# Patient Record
Sex: Female | Born: 1980 | Race: White | Hispanic: No | Marital: Married | Smoking: Never smoker
Health system: Southern US, Community
[De-identification: ages and names within clinical notes are randomized; demographics above are authoritative.]

## PROBLEM LIST (undated history)

## (undated) DIAGNOSIS — O009 Unspecified ectopic pregnancy without intrauterine pregnancy: Secondary | ICD-10-CM

## (undated) DIAGNOSIS — M5020 Other cervical disc displacement, unspecified cervical region: Secondary | ICD-10-CM

## (undated) DIAGNOSIS — F32A Depression, unspecified: Secondary | ICD-10-CM

## (undated) DIAGNOSIS — F329 Major depressive disorder, single episode, unspecified: Secondary | ICD-10-CM

## (undated) DIAGNOSIS — F419 Anxiety disorder, unspecified: Secondary | ICD-10-CM

## (undated) DIAGNOSIS — D649 Anemia, unspecified: Secondary | ICD-10-CM

## (undated) DIAGNOSIS — G43909 Migraine, unspecified, not intractable, without status migrainosus: Secondary | ICD-10-CM

## (undated) HISTORY — DX: Depression, unspecified: F32.A

## (undated) HISTORY — DX: Major depressive disorder, single episode, unspecified: F32.9

## (undated) HISTORY — PX: SPINE SURGERY: SHX786

## (undated) HISTORY — DX: Migraine, unspecified, not intractable, without status migrainosus: G43.909

## (undated) HISTORY — DX: Anemia, unspecified: D64.9

## (undated) HISTORY — DX: Anxiety disorder, unspecified: F41.9

## (undated) HISTORY — DX: Unspecified ectopic pregnancy without intrauterine pregnancy: O00.90

## (undated) HISTORY — DX: Other cervical disc displacement, unspecified cervical region: M50.20

---

## 2001-04-01 HISTORY — PX: APPENDECTOMY: SHX54

## 2001-10-12 ENCOUNTER — Encounter (INDEPENDENT_AMBULATORY_CARE_PROVIDER_SITE_OTHER): Payer: Self-pay | Admitting: Specialist

## 2001-10-12 ENCOUNTER — Inpatient Hospital Stay (HOSPITAL_COMMUNITY): Admission: EM | Admit: 2001-10-12 | Discharge: 2001-10-13 | Payer: Self-pay | Admitting: Emergency Medicine

## 2001-10-12 ENCOUNTER — Encounter: Payer: Self-pay | Admitting: Emergency Medicine

## 2001-12-11 ENCOUNTER — Other Ambulatory Visit: Admission: RE | Admit: 2001-12-11 | Discharge: 2001-12-11 | Payer: Self-pay | Admitting: Obstetrics & Gynecology

## 2002-10-15 ENCOUNTER — Other Ambulatory Visit: Admission: RE | Admit: 2002-10-15 | Discharge: 2002-10-15 | Payer: Self-pay | Admitting: Obstetrics and Gynecology

## 2004-11-18 ENCOUNTER — Emergency Department (HOSPITAL_COMMUNITY): Admission: EM | Admit: 2004-11-18 | Discharge: 2004-11-18 | Payer: Self-pay | Admitting: Family Medicine

## 2005-04-01 HISTORY — PX: ANKLE SURGERY: SHX546

## 2005-06-29 ENCOUNTER — Emergency Department (HOSPITAL_COMMUNITY): Admission: EM | Admit: 2005-06-29 | Discharge: 2005-06-29 | Payer: Self-pay | Admitting: Family Medicine

## 2005-06-29 ENCOUNTER — Ambulatory Visit (HOSPITAL_COMMUNITY): Admission: RE | Admit: 2005-06-29 | Discharge: 2005-06-29 | Payer: Self-pay | Admitting: Family Medicine

## 2005-09-03 ENCOUNTER — Ambulatory Visit (HOSPITAL_COMMUNITY): Admission: RE | Admit: 2005-09-03 | Discharge: 2005-09-03 | Payer: Self-pay | Admitting: Family Medicine

## 2005-12-25 ENCOUNTER — Ambulatory Visit (HOSPITAL_COMMUNITY): Admission: RE | Admit: 2005-12-25 | Discharge: 2005-12-25 | Payer: Self-pay | Admitting: Orthopedic Surgery

## 2006-06-15 ENCOUNTER — Emergency Department (HOSPITAL_COMMUNITY): Admission: EM | Admit: 2006-06-15 | Discharge: 2006-06-15 | Payer: Self-pay | Admitting: Family Medicine

## 2006-08-05 ENCOUNTER — Encounter: Admission: RE | Admit: 2006-08-05 | Discharge: 2006-08-05 | Payer: Self-pay | Admitting: Family Medicine

## 2006-08-27 ENCOUNTER — Encounter: Admission: RE | Admit: 2006-08-27 | Discharge: 2006-08-27 | Payer: Self-pay | Admitting: Family Medicine

## 2006-09-10 ENCOUNTER — Encounter: Admission: RE | Admit: 2006-09-10 | Discharge: 2006-09-10 | Payer: Self-pay | Admitting: Family Medicine

## 2006-12-25 ENCOUNTER — Other Ambulatory Visit: Admission: RE | Admit: 2006-12-25 | Discharge: 2006-12-25 | Payer: Self-pay | Admitting: Obstetrics and Gynecology

## 2009-04-01 HISTORY — PX: OTHER SURGICAL HISTORY: SHX169

## 2010-08-17 NOTE — Op Note (Signed)
[facility]  Patient:    Regina Tyler, Regina Tyler Visit Number: [id number] MRN: [id number]          Service Type: SUR Location: 3W [id number] 02 Attending Physician:  Henrene Dodge Dictated by:   Anselm Pancoast. Zachery Dakins, M.D. Proc. Date: 10/12/01 Admit Date:  10/12/2001 Discharge Date: 10/13/2001                             Operative Report  PREOPERATIVE DIAGNOSIS:  Acute appendicitis.  POSTOPERATIVE DIAGNOSIS:  Acute appendicitis.  OPERATION:  Appendectomy.  ANESTHESIA:  General.  SURGEON:  Anselm Pancoast. Zachery Dakins, M.D.  HISTORY:  The patient is a 30 year old female student in college, here from [place] who started having the onset of abdominal pain yesterday, sort of mild generalized, and then shifted to the lower abdomen.  She was seen in the ER at approximately 2 a.m.  They did labs and mildly elevated white count.  Negative urinalysis and then obtained a CT, and the CT showed a mildly enlarged appendix in the typical appendiceal area and there is where her pain is.  I was called at about 6:15, and on examination, she was definitely tender in the right lower quadrant.  She is on Wellbutrin for chronic depression and I was in agreement with the findings.  The CT does not show a markedly inflamed appendix, but the radiologist said this is definitely an enlarged early inflamed appendix.  DESCRIPTION OF PROCEDURE:  The patient was given 3 g of Unasyn and was taken to the operative suite.  She will have a small right lower quadrant incision and will be the same recuperation period as a laparoscopic appendectomy.  The incision was made and sharp dissection was made through the skin and subcutaneous, external oblique and internal oblique opened.  The peritoneum was opened.  An [employer] and the small end of a Goulet retractor could be placed in the incision.  You could see the appendix, flipped it up, and it was hyperemic.  There was no evidence of any  significant periappendiceal infection.  The appendiceal base was freed up.  The mesentery was tied between [facility] with 2-0 Vicryl and then the appendix was crushed and ligated with a 2-0 Vicryl pursestring suture, a 3-0 silk was placed, and the appendix was removed.  The stump inverted and the pursestring suture tied.  I placed a second Z-stitch for reinforcements.  There was no evidence of any bleeding. The cecum was dropped back into the peritoneal cavity.  The peritoneum and transversalis was closed in two layers with 2-0 Vicryl.  The external oblique was closed with interrupted sutures of 2-0 Vicryl, the Scarpas with 4-0 Vicryl subcuticular, and Benzoin and Steri-Strips on the skin.  The patient tolerated the procedure nicely and was sent to the recovery room extubated and breathing nicely.  I placed a little Marcaine in the incision for postoperative pain control.  Hopefully, the patient will be able to eat later today, and will probably discharge in the a.m. Dictated by:   Anselm Pancoast. Zachery Dakins, M.D. Attending Physician:  Henrene Dodge DD:  10/12/01 TD:  10/13/01 Job: [id number]
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
## 2011-10-11 ENCOUNTER — Ambulatory Visit: Payer: Self-pay | Admitting: Physician Assistant

## 2011-10-11 VITALS — BP 100/62 | HR 66 | Temp 98.3°F | Resp 16 | Ht 67.5 in | Wt 160.2 lb

## 2011-10-11 DIAGNOSIS — F909 Attention-deficit hyperactivity disorder, unspecified type: Secondary | ICD-10-CM

## 2011-10-11 MED ORDER — AMPHETAMINE-DEXTROAMPHETAMINE 20 MG PO TABS: 20.0000 mg | ORAL_TABLET | Freq: Three times a day (TID) | ORAL | Status: DC

## 2011-10-11 NOTE — Progress Notes (Signed)
  Subjective:    Patient ID: Regina Tyler, female    DOB: 03-13-1981, 31 y.o.   MRN: [id number]  HPI  Pt presents to clinic for medication refill.  She has been on Adderall for years.  She is doing well at her current dose.  She is having no side effects.  She recently got married.  Review of Systems     Objective:   Physical Exam  Vitals reviewed. Constitutional: She is oriented to person, place, and time. She appears well-developed and well-nourished.  HENT:  Head: Normocephalic and atraumatic.  Right Ear: External ear normal.  Left Ear: External ear normal.  Nose: Nose normal.  Eyes: Conjunctivae are normal.  Neck: Normal range of motion.  Cardiovascular: Normal rate, regular rhythm and normal heart sounds.   Pulmonary/Chest: Effort normal and breath sounds normal.  Neurological: She is alert and oriented to person, place, and time.  Skin: Skin is warm and dry.  Psychiatric: She has a normal mood and affect. Her behavior is normal. Judgment and thought content normal.          Assessment & Plan:   1. ADHD (attention deficit hyperactivity disorder)  amphetamine-dextroamphetamine (ADDERALL) 20 MG tablet, amphetamine-dextroamphetamine (ADDERALL) 20 MG tablet, amphetamine-dextroamphetamine (ADDERALL) 20 MG tablet   Pt to f/u in 6 months - can write another 3 month supply in 3 months.

## 2012-01-21 ENCOUNTER — Telehealth: Payer: Self-pay

## 2012-01-21 DIAGNOSIS — F909 Attention-deficit hyperactivity disorder, unspecified type: Secondary | ICD-10-CM

## 2012-01-21 MED ORDER — AMPHETAMINE-DEXTROAMPHETAMINE 20 MG PO TABS: 20.0000 mg | ORAL_TABLET | Freq: Three times a day (TID) | ORAL | Status: DC

## 2012-01-21 NOTE — Telephone Encounter (Signed)
Prescription printed and signed

## 2012-01-21 NOTE — Telephone Encounter (Signed)
Please advise. Pended Rx 

## 2012-01-21 NOTE — Telephone Encounter (Signed)
Pt is requesting a refill on amphetamine-dextroamphetamine (ADDERALL) 20 MG tablet [phone]

## 2012-01-22 NOTE — Telephone Encounter (Signed)
Called no voicemail set up. Rx up front ready for pick up

## 2012-01-22 NOTE — Telephone Encounter (Signed)
Patient advised.

## 2012-04-03 ENCOUNTER — Emergency Department (HOSPITAL_COMMUNITY)
Admission: EM | Admit: 2012-04-03 | Discharge: 2012-04-03 | Disposition: A | Discharge status: Home / Self Care | Payer: No Typology Code available for payment source | Attending: Emergency Medicine | Admitting: Emergency Medicine

## 2012-04-03 ENCOUNTER — Emergency Department (HOSPITAL_COMMUNITY): Payer: No Typology Code available for payment source

## 2012-04-03 ENCOUNTER — Encounter (HOSPITAL_COMMUNITY): Payer: Self-pay | Admitting: Emergency Medicine

## 2012-04-03 DIAGNOSIS — Y9389 Activity, other specified: Secondary | ICD-10-CM | POA: Insufficient documentation

## 2012-04-03 DIAGNOSIS — IMO0002 Reserved for concepts with insufficient information to code with codable children: Secondary | ICD-10-CM | POA: Insufficient documentation

## 2012-04-03 DIAGNOSIS — S199XXA Unspecified injury of neck, initial encounter: Secondary | ICD-10-CM | POA: Insufficient documentation

## 2012-04-03 DIAGNOSIS — S0993XA Unspecified injury of face, initial encounter: Secondary | ICD-10-CM | POA: Insufficient documentation

## 2012-04-03 MED ORDER — HYDROCODONE-ACETAMINOPHEN 5-325 MG PO TABS: 2.0000 | ORAL_TABLET | Freq: Four times a day (QID) | ORAL | Status: DC | PRN

## 2012-04-03 MED ORDER — DIAZEPAM 5 MG PO TABS: 5.0000 mg | ORAL_TABLET | Freq: Two times a day (BID) | ORAL | Status: DC
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
## 2012-04-04 NOTE — ED Provider Notes (Signed)
Medical screening examination/treatment/procedure(s) were performed by non-physician practitioner and as supervising physician I was immediately available for consultation/collaboration.  Josue Kass T Jayven Naill, MD 04/04/12 1647 

## 2012-04-09 DIAGNOSIS — S161XXA Strain of muscle, fascia and tendon at neck level, initial encounter: Secondary | ICD-10-CM | POA: Insufficient documentation

## 2012-07-13 ENCOUNTER — Ambulatory Visit (INDEPENDENT_AMBULATORY_CARE_PROVIDER_SITE_OTHER): Payer: BC Managed Care – PPO | Admitting: Internal Medicine

## 2012-07-13 ENCOUNTER — Telehealth: Payer: Self-pay

## 2012-07-13 VITALS — BP 138/96 | HR 100 | Temp 99.4°F | Resp 16 | Ht 67.8 in | Wt 179.8 lb

## 2012-07-13 DIAGNOSIS — F909 Attention-deficit hyperactivity disorder, unspecified type: Secondary | ICD-10-CM

## 2012-07-13 DIAGNOSIS — F988 Other specified behavioral and emotional disorders with onset usually occurring in childhood and adolescence: Secondary | ICD-10-CM

## 2012-07-13 MED ORDER — AMPHETAMINE-DEXTROAMPHETAMINE 20 MG PO TABS: 20.0000 mg | ORAL_TABLET | Freq: Three times a day (TID) | ORAL | Status: DC

## 2012-07-13 NOTE — Telephone Encounter (Signed)
Patient would like a refill on her adderall if possible  [phone]

## 2012-07-13 NOTE — Telephone Encounter (Signed)
Called patient she is over due for follow up, what is her follow up plan? Called her, she had no plans to follow up, but was transferred to make appt, when she realized she was due for appt, do you want to prescribe until she can come in or do you want her to wait until appt?
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
## 2012-07-14 NOTE — Telephone Encounter (Signed)
Pt was seen yesterday.

## 2012-08-05 ENCOUNTER — Ambulatory Visit: Payer: Self-pay | Admitting: Internal Medicine

## 2012-10-14 ENCOUNTER — Ambulatory Visit (INDEPENDENT_AMBULATORY_CARE_PROVIDER_SITE_OTHER): Payer: BC Managed Care – PPO | Admitting: Diagnostic Neuroimaging

## 2012-10-14 ENCOUNTER — Encounter: Payer: Self-pay | Admitting: Diagnostic Neuroimaging

## 2012-10-14 VITALS — BP 107/75 | HR 94 | Temp 98.0°F | Ht 67.5 in | Wt 182.0 lb

## 2012-10-14 DIAGNOSIS — M542 Cervicalgia: Secondary | ICD-10-CM | POA: Insufficient documentation

## 2012-10-14 DIAGNOSIS — T148XXA Other injury of unspecified body region, initial encounter: Secondary | ICD-10-CM | POA: Insufficient documentation

## 2012-10-14 MED ORDER — DULOXETINE HCL 60 MG PO CPEP: 60.0000 mg | ORAL_CAPSULE | Freq: Every day | ORAL | Status: DC

## 2012-10-14 MED ORDER — CYCLOBENZAPRINE HCL 10 MG PO TABS: 10.0000 mg | ORAL_TABLET | Freq: Three times a day (TID) | ORAL | Status: DC | PRN

## 2012-10-14 MED ORDER — DULOXETINE HCL 30 MG PO CPEP: 30.0000 mg | ORAL_CAPSULE | Freq: Every day | ORAL | Status: DC
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
## 2012-10-14 NOTE — Patient Instructions (Signed)
Try physical therapy, flexeril and cymbalta.

## 2012-10-21 ENCOUNTER — Telehealth: Payer: Self-pay | Admitting: Diagnostic Neuroimaging

## 2012-10-21 ENCOUNTER — Ambulatory Visit: Payer: BC Managed Care – PPO | Attending: Diagnostic Neuroimaging | Admitting: Physical Therapy

## 2012-10-21 DIAGNOSIS — M2560 Stiffness of unspecified joint, not elsewhere classified: Secondary | ICD-10-CM | POA: Insufficient documentation

## 2012-10-21 DIAGNOSIS — M542 Cervicalgia: Secondary | ICD-10-CM | POA: Insufficient documentation

## 2012-10-21 DIAGNOSIS — IMO0001 Reserved for inherently not codable concepts without codable children: Secondary | ICD-10-CM | POA: Insufficient documentation

## 2012-10-21 DIAGNOSIS — R5381 Other malaise: Secondary | ICD-10-CM | POA: Insufficient documentation

## 2012-10-21 NOTE — Telephone Encounter (Signed)
Please refer patient to follow up with her PCP asap to evaluate chest pain. -VRP

## 2012-10-21 NOTE — Telephone Encounter (Signed)
Tried to reach patient and spouse. No answer. Left vmail on patient's number.

## 2012-10-21 NOTE — Telephone Encounter (Signed)
Physical Therapist Bonnielee Haff reports patient has chest pain when neck is flexed. Pain is below sternum. Palpations of T10 w/ cervical distraction. Jaw pain when head is rotated to the right.

## 2012-10-27 ENCOUNTER — Ambulatory Visit: Payer: BC Managed Care – PPO | Admitting: Physical Therapy

## 2012-10-29 ENCOUNTER — Ambulatory Visit: Payer: BC Managed Care – PPO | Admitting: Physical Therapy

## 2012-11-03 ENCOUNTER — Ambulatory Visit: Payer: No Typology Code available for payment source | Admitting: Physical Therapy

## 2012-11-05 ENCOUNTER — Encounter: Payer: Self-pay | Admitting: Physical Therapy

## 2012-12-09 ENCOUNTER — Telehealth: Payer: Self-pay | Admitting: Diagnostic Neuroimaging

## 2012-12-09 DIAGNOSIS — M542 Cervicalgia: Secondary | ICD-10-CM

## 2012-12-09 DIAGNOSIS — T148XXA Other injury of unspecified body region, initial encounter: Secondary | ICD-10-CM

## 2012-12-09 NOTE — Telephone Encounter (Signed)
Patient is wanting a referral to [facility]. Please advise.

## 2012-12-15 NOTE — Telephone Encounter (Signed)
I called pt and she was calling for PT order again due to not being able to follow thru with this the first time due to chest pain.  She used [facility] Bonnielee Haff as her therapist.  Location she thought on [facility].

## 2013-01-08 ENCOUNTER — Ambulatory Visit: Payer: Self-pay | Attending: Diagnostic Neuroimaging | Admitting: Physical Therapy

## 2013-01-08 DIAGNOSIS — M542 Cervicalgia: Secondary | ICD-10-CM | POA: Insufficient documentation

## 2013-01-08 DIAGNOSIS — IMO0001 Reserved for inherently not codable concepts without codable children: Secondary | ICD-10-CM | POA: Insufficient documentation

## 2013-01-08 DIAGNOSIS — M2560 Stiffness of unspecified joint, not elsewhere classified: Secondary | ICD-10-CM | POA: Insufficient documentation

## 2013-01-08 DIAGNOSIS — R5381 Other malaise: Secondary | ICD-10-CM | POA: Insufficient documentation

## 2013-01-13 ENCOUNTER — Ambulatory Visit: Payer: Self-pay | Admitting: Physical Therapy

## 2013-01-15 ENCOUNTER — Ambulatory Visit: Payer: BC Managed Care – PPO | Admitting: Physical Therapy

## 2013-01-19 ENCOUNTER — Ambulatory Visit: Payer: Self-pay | Admitting: Physical Therapy

## 2013-01-22 ENCOUNTER — Ambulatory Visit: Payer: BC Managed Care – PPO | Admitting: Physical Therapy

## 2013-01-25 ENCOUNTER — Ambulatory Visit: Payer: BC Managed Care – PPO | Admitting: Physical Therapy

## 2013-01-28 ENCOUNTER — Ambulatory Visit: Payer: BC Managed Care – PPO | Admitting: Physical Therapy

## 2013-02-01 ENCOUNTER — Ambulatory Visit: Payer: Self-pay | Attending: Diagnostic Neuroimaging | Admitting: Physical Therapy

## 2013-02-01 DIAGNOSIS — R5381 Other malaise: Secondary | ICD-10-CM | POA: Insufficient documentation

## 2013-02-01 DIAGNOSIS — M542 Cervicalgia: Secondary | ICD-10-CM | POA: Insufficient documentation

## 2013-02-01 DIAGNOSIS — IMO0001 Reserved for inherently not codable concepts without codable children: Secondary | ICD-10-CM | POA: Insufficient documentation

## 2013-02-01 DIAGNOSIS — M2560 Stiffness of unspecified joint, not elsewhere classified: Secondary | ICD-10-CM | POA: Insufficient documentation

## 2013-02-04 ENCOUNTER — Ambulatory Visit: Payer: Self-pay | Admitting: Physical Therapy

## 2013-02-09 ENCOUNTER — Ambulatory Visit: Payer: Self-pay | Admitting: Physical Therapy

## 2013-02-11 ENCOUNTER — Ambulatory Visit: Payer: BC Managed Care – PPO | Admitting: Physical Therapy

## 2013-02-16 ENCOUNTER — Encounter: Payer: BC Managed Care – PPO | Admitting: Physical Therapy

## 2013-03-05 ENCOUNTER — Telehealth: Payer: Self-pay | Admitting: Diagnostic Neuroimaging

## 2013-03-05 NOTE — Telephone Encounter (Signed)
Please advise 

## 2013-03-05 NOTE — Telephone Encounter (Signed)
Setup sooner follow up appointment with me or Larita Fife in next 2-4 weeks. -VRP

## 2013-03-08 NOTE — Telephone Encounter (Signed)
Patient was scheduled appt on April 07, 2013 at 9:30 am. Patient verbalized understanding, per Dr. Marjory Lies.

## 2013-04-07 ENCOUNTER — Ambulatory Visit (INDEPENDENT_AMBULATORY_CARE_PROVIDER_SITE_OTHER): Payer: Self-pay | Admitting: Nurse Practitioner

## 2013-04-07 ENCOUNTER — Encounter: Payer: Self-pay | Admitting: Nurse Practitioner

## 2013-04-07 VITALS — BP 121/84 | HR 116 | Ht 68.0 in | Wt 184.0 lb

## 2013-04-07 DIAGNOSIS — T148XXA Other injury of unspecified body region, initial encounter: Secondary | ICD-10-CM

## 2013-04-07 DIAGNOSIS — M542 Cervicalgia: Secondary | ICD-10-CM

## 2013-04-07 MED ORDER — TRAMADOL HCL 50 MG PO TABS: 100.0000 mg | ORAL_TABLET | Freq: Four times a day (QID) | ORAL | Status: DC | PRN

## 2013-04-07 NOTE — Patient Instructions (Signed)
PLAN: 1. Referral to [facility] or another of your choice for possible injections or accupuncture. 2. Trial of Tramadol for pain. 3. Trial of flexeril prn for muscle spasm  4. Hopefully symptoms will gradually improve
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
## 2013-06-09 ENCOUNTER — Ambulatory Visit: Payer: BC Managed Care – PPO | Admitting: Diagnostic Neuroimaging

## 2013-08-20 ENCOUNTER — Ambulatory Visit: Payer: Self-pay | Admitting: Family Medicine

## 2013-08-20 VITALS — BP 126/78 | HR 82 | Temp 98.2°F | Resp 17 | Ht 68.0 in | Wt 186.0 lb

## 2013-08-20 DIAGNOSIS — F909 Attention-deficit hyperactivity disorder, unspecified type: Secondary | ICD-10-CM

## 2013-08-20 MED ORDER — AMPHETAMINE-DEXTROAMPHETAMINE 20 MG PO TABS: 20.0000 mg | ORAL_TABLET | Freq: Three times a day (TID) | ORAL | Status: DC

## 2013-08-20 MED ORDER — AMPHETAMINE-DEXTROAMPHETAMINE 20 MG PO TABS
20.0000 mg | ORAL_TABLET | Freq: Three times a day (TID) | ORAL | Status: DC
Start: 2013-08-20 — End: 2013-12-05

## 2013-08-20 NOTE — Progress Notes (Signed)
°  This chart was scribed for Johnn Hai, MD by Joaquin Music, ED Scribe. This patient was seen in room Room/bed 1 and the patient's care was started at 1:01 PM. Subjective:    Patient ID: Regina Tyler, female    DOB: March 06, 1981, 33 y.o.   MRN: [id number] Chief Complaint  Patient presents with   Medication Refill    ADDERALL   HPI Regina Tyler is a 33 y.o. female who presents to the [facility] for medication, adderall, refill. Pt is taking Adderall x 3 daily. States she realized when she would take 1 adderall, it would wear off quickly. She has been financially in a bind due to inactive insurance and reports not have her adderall for a long period of time. States she was diagnosed with ADHD. States she was referred to Dr. Sharrell Ku by Dr. Talbot Grumbling for her ADHD.  Pt is a Oceanographer. Performs guitar and banjo.  Review of Systems  All other systems reviewed and are negative.  Objective:   Physical Exam  Nursing note and vitals reviewed. Constitutional: She is oriented to person, place, and time. She appears well-developed and well-nourished. No distress.  HENT:  Head: Normocephalic and atraumatic.  Eyes: EOM are normal.  Neck: Neck supple. No tracheal deviation present.  Cardiovascular: Normal rate, regular rhythm and normal heart sounds.   Pulmonary/Chest: Effort normal. No respiratory distress.  Musculoskeletal: Normal range of motion.  Neurological: She is alert and oriented to person, place, and time.  Skin: Skin is warm and dry.  Psychiatric: She has a normal mood and affect. Her behavior is normal.   Triage Vitals:BP 126/78   Pulse 82   Temp(Src) 98.2 F (36.8 C) (Oral)   Resp 17   Ht 5\' 8"  (1.727 m)   Wt 186 lb (84.369 kg)   BMI 28.29 kg/m2   SpO2 100% Assessment & Plan:  Discussed the use of Adderall. Originally filled by Dr. Merla Riches. Will discharge with 3 month supply of Adderall.   ADHD (attention deficit hyperactivity disorder) - Plan:  amphetamine-dextroamphetamine (ADDERALL) 20 MG tablet, DISCONTINUED: amphetamine-dextroamphetamine (ADDERALL) 20 MG tablet, DISCONTINUED: amphetamine-dextroamphetamine (ADDERALL) 20 MG tablet  Signed, Elvina Sidle, MD

## 2013-08-20 NOTE — Patient Instructions (Signed)
Attention Deficit Hyperactivity Disorder Attention deficit hyperactivity disorder (ADHD) is a problem with behavior issues based on the way the brain functions (neurobehavioral disorder). It is a common reason for behavior and academic problems in school. SYMPTOMS  There are 3 types of ADHD. The 3 types and some of the symptoms include:  Inattentive  Gets bored or distracted easily.  Loses or forgets things. Forgets to hand in homework.  Has trouble organizing or completing tasks.  Difficulty staying on task.  An inability to organize daily tasks and school work.  Leaving projects, chores, or homework unfinished.  Trouble paying attention or responding to details. Careless mistakes.  Difficulty following directions. Often seems like is not listening.  Dislikes activities that require sustained attention (like chores or homework).  Hyperactive-impulsive  Feels like it is impossible to sit still or stay in a seat. Fidgeting with hands and feet.  Trouble waiting turn.  Talking too much or out of turn. Interruptive.  Speaks or acts impulsively.  Aggressive, disruptive behavior.  Constantly busy or on the go, noisy.  Often leaves seat when they are expected to remain seated.  Often runs or climbs where it is not appropriate, or feels very restless.  Combined  Has symptoms of both of the above. Often children with ADHD feel discouraged about themselves and with school. They often perform well below their abilities in school. As children get older, the excess motor activities can calm down, but the problems with paying attention and staying organized persist. Most children do not outgrow ADHD but with good treatment can learn to cope with the symptoms. DIAGNOSIS  When ADHD is suspected, the diagnosis should be made by professionals trained in ADHD. This professional will collect information about the individual suspected of having ADHD. Information must be collected from  various settings where the person lives, works, or attends school.  Diagnosis will include:  Confirming symptoms began in childhood.  Ruling out other reasons for the child's behavior.  The health care providers will check with the child's school and check their medical records.  They will talk to teachers and parents.  Behavior rating scales for the child will be filled out by those dealing with the child on a daily basis. A diagnosis is made only after all information has been considered. TREATMENT  Treatment usually includes behavioral treatment, tutoring or extra support in school, and stimulant medicines. Because of the way a person's brain works with ADHD, these medicines decrease impulsivity and hyperactivity and increase attention. This is different than how they would work in a person who does not have ADHD. Other medicines used include antidepressants and certain blood pressure medicines. Most experts agree that treatment for ADHD should address all aspects of the person's functioning. Along with medicines, treatment should include structured classroom management at school. Parents should reward good behavior, provide constant discipline, and limit-setting. Tutoring should be available for the child as needed. ADHD is a life-long condition. If untreated, the disorder can have long-term serious effects into adolescence and adulthood. HOME CARE INSTRUCTIONS   Often with ADHD there is a lot of frustration among family members dealing with the condition. Blame and anger are also feelings that are common. In many cases, because the problem affects the family as a whole, the entire family may need help. A therapist can help the family find better ways to handle the disruptive behaviors of the person with ADHD and promote change. If the person with ADHD is young, most of the therapist's work   is with the parents. Parents will learn techniques for coping with and improving their child's  behavior. Sometimes only the child with the ADHD needs counseling. Your health care providers can help you make these decisions.  Children with ADHD may need help learning how to organize. Some helpful tips include:  Keep routines the same every day from wake-up time to bedtime. Schedule all activities, including homework and playtime. Keep the schedule in a place where the person with ADHD will often see it. Mark schedule changes as far in advance as possible.  Schedule outdoor and indoor recreation.  Have a place for everything and keep everything in its place. This includes clothing, backpacks, and school supplies.  Encourage writing down assignments and bringing home needed books. Work with your child's teachers for assistance in organizing school work.  Offer your child a well-balanced diet. Breakfast that includes a balance of whole grains, protein and, fruits or vegetables is especially important for school performance. Children should avoid drinks with caffeine including:  Soft drinks.  Coffee.  Tea.  However, some older children (adolescents) may find these drinks helpful in improving their attention. Because it can also be common for adolescents with ADHD to become addicted to caffeine, talk with your health care provider about what is a safe amount of caffeine intake for your child.  Children with ADHD need consistent rules that they can understand and follow. If rules are followed, give small rewards. Children with ADHD often receive, and expect, criticism. Look for good behavior and praise it. Set realistic goals. Give clear instructions. Look for activities that can foster success and self-esteem. Make time for pleasant activities with your child. Give lots of affection.  Parents are their children's greatest advocates. Learn as much as possible about ADHD. This helps you become a stronger and better advocate for your child. It also helps you educate your child's teachers and  instructors if they feel inadequate in these areas. Parent support groups are often helpful. A national group with local chapters is called Children and Adults with Attention Deficit Hyperactivity Disorder (CHADD). SEEK MEDICAL CARE IF:  Your child has repeated muscle twitches, cough or speech outbursts.  Your child has sleep problems.  Your child has a marked loss of appetite.  Your child develops depression.  Your child has new or worsening behavioral problems.  Your child develops dizziness.  Your child has a racing heart.  Your child has stomach pains.  Your child develops headaches. SEEK IMMEDIATE MEDICAL CARE IF:  Your child has been diagnosed with depression or anxiety and the symptoms seem to be getting worse.  Your child has been depressed and suddenly appears to have increased energy or motivation.  You are worried that your child is having a bad reaction to a medication he or she is taking for ADHD. Document Released: 03/08/2002 Document Revised: 01/06/2013 Document Reviewed: 11/23/2012 Exit[employer] Patient Information 2014 [employer].  

## 2013-10-05 ENCOUNTER — Telehealth: Payer: Self-pay | Admitting: Nurse Practitioner

## 2013-10-05 ENCOUNTER — Ambulatory Visit: Payer: Self-pay | Admitting: Nurse Practitioner

## 2013-10-05 NOTE — Telephone Encounter (Signed)
Patient was no show for today's office appointment.  

## 2013-11-30 ENCOUNTER — Telehealth: Payer: Self-pay

## 2013-11-30 NOTE — Telephone Encounter (Signed)
Pt needs a refill on her adderall.  [phone]

## 2013-12-01 NOTE — Telephone Encounter (Signed)
Patient was referred to [facility] to see Dr. Merla Riches.  I don't feel comfortable treating ADHD patients unless they are in college (temporarily).  I can authorize refill one month worth of medicine but she needs to return to psychiatry or follow up with Dr. Merla Riches

## 2013-12-02 NOTE — Telephone Encounter (Signed)
Transferred pt to schedule an appt. 

## 2013-12-02 NOTE — Telephone Encounter (Signed)
Have her set up appt at 104---can add on if necessary

## 2013-12-05 ENCOUNTER — Ambulatory Visit (INDEPENDENT_AMBULATORY_CARE_PROVIDER_SITE_OTHER): Payer: Self-pay | Admitting: Internal Medicine

## 2013-12-05 VITALS — BP 110/72 | HR 83 | Temp 98.3°F | Resp 17 | Ht 67.5 in | Wt 184.0 lb

## 2013-12-05 DIAGNOSIS — F909 Attention-deficit hyperactivity disorder, unspecified type: Secondary | ICD-10-CM

## 2013-12-05 DIAGNOSIS — M542 Cervicalgia: Secondary | ICD-10-CM

## 2013-12-05 DIAGNOSIS — F900 Attention-deficit hyperactivity disorder, predominantly inattentive type: Secondary | ICD-10-CM

## 2013-12-05 MED ORDER — AMPHETAMINE-DEXTROAMPHETAMINE 20 MG PO TABS: 20.0000 mg | ORAL_TABLET | Freq: Three times a day (TID) | ORAL | Status: DC

## 2013-12-05 MED ORDER — MELOXICAM 15 MG PO TABS: 15.0000 mg | ORAL_TABLET | Freq: Every day | ORAL | Status: DC
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
## 2013-12-30 ENCOUNTER — Emergency Department (HOSPITAL_COMMUNITY)
Admission: EM | Admit: 2013-12-30 | Discharge: 2013-12-30 | Disposition: A | Discharge status: Home / Self Care | Payer: Self-pay | Attending: Emergency Medicine | Admitting: Emergency Medicine

## 2013-12-30 ENCOUNTER — Encounter (HOSPITAL_COMMUNITY): Payer: Self-pay | Admitting: Emergency Medicine

## 2013-12-30 ENCOUNTER — Emergency Department (HOSPITAL_COMMUNITY): Payer: Self-pay

## 2013-12-30 ENCOUNTER — Emergency Department (INDEPENDENT_AMBULATORY_CARE_PROVIDER_SITE_OTHER)
Admission: EM | Admit: 2013-12-30 | Discharge: 2013-12-30 | Disposition: A | Discharge status: Home / Self Care | Payer: Self-pay | Source: Home / Self Care | Attending: Emergency Medicine | Admitting: Emergency Medicine

## 2013-12-30 DIAGNOSIS — F419 Anxiety disorder, unspecified: Secondary | ICD-10-CM | POA: Insufficient documentation

## 2013-12-30 DIAGNOSIS — R200 Anesthesia of skin: Secondary | ICD-10-CM | POA: Insufficient documentation

## 2013-12-30 DIAGNOSIS — M5441 Lumbago with sciatica, right side: Secondary | ICD-10-CM

## 2013-12-30 DIAGNOSIS — Z79899 Other long term (current) drug therapy: Secondary | ICD-10-CM | POA: Insufficient documentation

## 2013-12-30 DIAGNOSIS — M5116 Intervertebral disc disorders with radiculopathy, lumbar region: Secondary | ICD-10-CM

## 2013-12-30 DIAGNOSIS — Z791 Long term (current) use of non-steroidal anti-inflammatories (NSAID): Secondary | ICD-10-CM | POA: Insufficient documentation

## 2013-12-30 DIAGNOSIS — R292 Abnormal reflex: Secondary | ICD-10-CM

## 2013-12-30 DIAGNOSIS — M5106 Intervertebral disc disorders with myelopathy, lumbar region: Secondary | ICD-10-CM | POA: Insufficient documentation

## 2013-12-30 DIAGNOSIS — Z3202 Encounter for pregnancy test, result negative: Secondary | ICD-10-CM | POA: Insufficient documentation

## 2013-12-30 DIAGNOSIS — M5126 Other intervertebral disc displacement, lumbar region: Secondary | ICD-10-CM | POA: Diagnosis present

## 2013-12-30 DIAGNOSIS — Z862 Personal history of diseases of the blood and blood-forming organs and certain disorders involving the immune mechanism: Secondary | ICD-10-CM | POA: Insufficient documentation

## 2013-12-30 LAB — URINALYSIS, ROUTINE W REFLEX MICROSCOPIC
BILIRUBIN URINE: NEGATIVE
Glucose, UA: NEGATIVE mg/dL
Hgb urine dipstick: NEGATIVE
Ketones, ur: 15 mg/dL — AB
LEUKOCYTES UA: NEGATIVE
NITRITE: NEGATIVE
PH: 5 (ref 5.0–8.0)
Protein, ur: NEGATIVE mg/dL
SPECIFIC GRAVITY, URINE: 1.008 (ref 1.005–1.030)
UROBILINOGEN UA: 0.2 mg/dL (ref 0.0–1.0)

## 2013-12-30 LAB — POC URINE PREG, ED: Preg Test, Ur: NEGATIVE

## 2013-12-30 MED ORDER — IBUPROFEN 800 MG PO TABS: 800.0000 mg | ORAL_TABLET | Freq: Three times a day (TID) | ORAL | Status: DC | PRN

## 2013-12-30 MED ORDER — KETOROLAC TROMETHAMINE 60 MG/2ML IM SOLN
60.0000 mg | Freq: Once | INTRAMUSCULAR | Status: AC
  Administered 2013-12-30: 60 mg via INTRAMUSCULAR

## 2013-12-30 MED ORDER — HYDROMORPHONE HCL 1 MG/ML IJ SOLN
1.0000 mg | Freq: Once | INTRAMUSCULAR | Status: AC
  Administered 2013-12-30: 1 mg via INTRAVENOUS
  Filled 2013-12-30: qty 1

## 2013-12-30 MED ORDER — HYDROCODONE-ACETAMINOPHEN 5-325 MG PO TABS
2.0000 | ORAL_TABLET | Freq: Once | ORAL | Status: AC
  Administered 2013-12-30: 2 via ORAL

## 2013-12-30 MED ORDER — HYDROCODONE-ACETAMINOPHEN 5-325 MG PO TABS
ORAL_TABLET | ORAL | Status: AC
  Filled 2013-12-30: qty 2

## 2013-12-30 MED ORDER — METHYLPREDNISOLONE (PAK) 4 MG PO TABS
ORAL_TABLET | ORAL | Status: DC
Start: 2013-12-30 — End: 2014-08-27

## 2013-12-30 MED ORDER — KETOROLAC TROMETHAMINE 60 MG/2ML IM SOLN
INTRAMUSCULAR | Status: AC
  Filled 2013-12-30: qty 2

## 2013-12-30 MED ORDER — KETOROLAC TROMETHAMINE 30 MG/ML IJ SOLN
30.0000 mg | Freq: Once | INTRAMUSCULAR | Status: AC
  Administered 2013-12-30: 30 mg via INTRAVENOUS
  Filled 2013-12-30: qty 1

## 2013-12-30 MED ORDER — DIAZEPAM 5 MG PO TABS: 5.0000 mg | ORAL_TABLET | Freq: Three times a day (TID) | ORAL | Status: DC | PRN

## 2013-12-30 MED ORDER — METHYLPREDNISOLONE SODIUM SUCC 125 MG IJ SOLR
125.0000 mg | Freq: Once | INTRAMUSCULAR | Status: AC
  Administered 2013-12-30: 125 mg via INTRAVENOUS
  Filled 2013-12-30: qty 2

## 2013-12-30 MED ORDER — ONDANSETRON HCL 4 MG/2ML IJ SOLN
4.0000 mg | Freq: Once | INTRAMUSCULAR | Status: AC
  Administered 2013-12-30: 4 mg via INTRAVENOUS
  Filled 2013-12-30: qty 2

## 2013-12-30 MED ORDER — OXYCODONE-ACETAMINOPHEN 5-325 MG PO TABS
1.0000 | ORAL_TABLET | ORAL | Status: DC | PRN
Start: 2013-12-30 — End: 2014-01-03

## 2013-12-30 NOTE — ED Notes (Signed)
Low back pain x 2 weeks. Rt. Groin numbness. gotten worse in 24 hrs. Went to [facility]: they gave x2 Vicodin and Toradol.

## 2013-12-30 NOTE — ED Provider Notes (Signed)
Medical screening examination/treatment/procedure(s) were performed by non-physician practitioner and as supervising physician I was immediately available for consultation/collaboration.  Leslee Homeavid Alexxus Sobh, M.D.  Reuben Likesavid C Hoa Briggs, MD 12/30/13 2222

## 2013-12-30 NOTE — Discharge Instructions (Signed)
Herniated Disk A herniated disk occurs when a disk in your spine bulges out too far. This condition is also called a ruptured disk or slipped disk. Your spine (backbone) is made up of bones called vertebrae. Between each pair of vertebrae is an oval disk with a soft, spongy center that acts as a shock absorber when you move. The spongy center is surrounded by a tough outer ring. When you have a herniated disk, the spongy center of the disk bulges out or ruptures through the outer ring. A herniated disk can press on a nerve between your vertebrae and cause pain. A herniated disk can occur anywhere in your back or neck area, but the lower back is the most common spot. CAUSES  In many cases, a herniated disk occurs just from getting older. As you age, the spongy insides of your disks tend to shrink and dry out. A herniated disk can result from gradual wear and tear. Injury or sudden strain can also cause a herniated disk.  RISK FACTORS Aging is the main risk factor for a herniated disk. Other risk factors include:  Being a man between the ages of 30 and 50 years.  Having a job that requires heavy lifting, bending, or twisting.  Having a job that requires long hours of driving.  Not getting enough exercise.  Being overweight.  Smoking. SIGNS AND SYMPTOMS  Signs and symptoms depend on which disk is herniated.  For a herniated disk in the lower back, you may have sharp pain in:  One part of your leg, hip, or buttocks.  The back of your calf.  The top or sole of your foot (sciatica).   For a herniated disk in the neck, you may feel pain:  When you move your neck.  Near or over your shoulder blade.  That moves to your upper arm, forearm, or fingers.   You may also have muscle weakness. It may be hard to:  Lift your leg or arm.  Stand on your toes.  Squeeze tightly with one of your hands.  Other symptoms can include:  Numbness or tingling in the affected areas of your  body.  Loss of bladder or bowel control. This is a rare but serious sign of a severe herniated disk in the lower back. DIAGNOSIS  Your health care provider will do a physical exam. During this exam, you may have to move certain body parts or assume various positions. For example, your health care provider may do the straight-leg test. This is a good way to test for a herniated disk in your lower back. In this test, the health care provider lifts your leg while you lie on your back. This is to see if you feel pain down your leg. Your health care provider will also check for numbness or loss of feeling.  Your health care provider will also check your:  Reflexes.  Muscle strength.  Posture.  Other tests may be done to help in making a diagnosis. These may include:  An X-ray of the spine to rule out other causes of back pain.   Other imaging studies, such as an MRI or CT scan. This is to check whether the herniated disk is pressing on your spinal canal.  Electromyography (EMG). This test checks the nerves that control muscles. It is sometimes used to identify the specific area of nerve involvement.  TREATMENT  In many cases, herniated disk symptoms go away over a period of days or weeks. You will most   likely be free of symptoms in 3-4 months. Treatment may include the following:  The initial treatment for a herniated disk is ashort period of rest.  Bed rest is often limited to 1 or 2 days. Resting for too long delays recovery.  If you have a herniated disk in your lower back, you should avoid sitting as much as possible because sitting increases pressure on the disk.  Medicines. These may include:   Nonsteroidal anti-inflammatory drugs (NSAIDs).  Muscle relaxants for back spasms.  Narcotic pain medicine if your pain is very bad.   Steroid injections. You may need these along the involved nerve root to help control pain. The steroid is injected in the area of the herniated disk.  It helps by reducing swelling around the disk.  Physical therapy. This may include exercises to strengthen the muscles that help support your spine.   You may need surgery if other treatments do not work.  HOME CARE INSTRUCTIONS Follow all your health care provider's instructions. These may include:  Take all medicines as directed by your health care provider.  Rest for 2 days and then start moving.  Do not sit or stand for long periods of time.  Maintain good posture when sitting and standing.  Avoid movements that cause pain, such as bending or lifting.  When you are able to start lifting things again:  Bend with your knees.  Keep your back straight.  Hold heavy objects close to your body.  If you are overweight, ask your health care provider to help you start a weight-loss program.  When you are able to start exercising, ask your health care provider how much and what type of exercise is best for you.  Work with a physical therapist on stretching and strengthening exercises for your back.  Do not wear high-heeled shoes.  Do not sleep on your belly.  Do not smoke.  Keep all follow-up visits as directed by your health care provider. SEEK MEDICAL CARE IF:  You have back or neck pain that is not getting better after 4 weeks.  You have very bad pain in your back or neck.  You develop numbness, tingling, or weakness along with pain. SEEK IMMEDIATE MEDICAL CARE IF:   You have numbness, tingling, or weakness that makes you unable to use your arms or legs.  You lose control of your bladder or bowels.  You have dizziness or fainting.  You have shortness of breath.  MAKE SURE YOU:   Understand these instructions.  Will watch your condition.  Will get help right away if you are not doing well or get worse. Document Released: 03/15/2000 Document Revised: 08/02/2013 Document Reviewed: 02/19/2013 Exit[employer] Patient Information 2015 [employer]. This information  is not intended to replace advice given to you by your health care provider. Make sure you discuss any questions you have with your health care provider.  

## 2013-12-30 NOTE — ED Notes (Signed)
I gave the patients visitor a cup ice and a coke.
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
## 2013-12-30 NOTE — ED Notes (Signed)
Bladder scan showed 0 urine in bladder.

## 2013-12-30 NOTE — ED Notes (Signed)
Back  Pain         Pt  Reports        sev  Weeks               Pt   Reports  Pain much  Worse  Over  Last  Several  Days             Pain radiates  Down r leg   And              Woke  Her  Up  During  Her  Sleep

## 2013-12-30 NOTE — ED Notes (Signed)
[facility]
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
## 2014-01-03 ENCOUNTER — Ambulatory Visit (INDEPENDENT_AMBULATORY_CARE_PROVIDER_SITE_OTHER): Payer: Self-pay | Admitting: Internal Medicine

## 2014-01-03 VITALS — BP 118/84 | HR 82 | Temp 97.9°F | Resp 16 | Ht 68.0 in | Wt 184.8 lb

## 2014-01-03 DIAGNOSIS — M5416 Radiculopathy, lumbar region: Secondary | ICD-10-CM

## 2014-01-03 DIAGNOSIS — M5126 Other intervertebral disc displacement, lumbar region: Secondary | ICD-10-CM

## 2014-01-03 MED ORDER — CYCLOBENZAPRINE HCL 10 MG PO TABS: 10.0000 mg | ORAL_TABLET | Freq: Three times a day (TID) | ORAL | Status: DC | PRN

## 2014-01-03 MED ORDER — KETOROLAC TROMETHAMINE 60 MG/2ML IM SOLN
60.0000 mg | Freq: Once | INTRAMUSCULAR | Status: AC
Start: 2014-01-03 — End: 2014-01-03
  Administered 2014-01-03: 60 mg via INTRAMUSCULAR

## 2014-01-03 MED ORDER — PREDNISONE 20 MG PO TABS: ORAL_TABLET | ORAL | Status: DC

## 2014-01-03 MED ORDER — MELOXICAM 15 MG PO TABS: 15.0000 mg | ORAL_TABLET | Freq: Every day | ORAL | Status: DC

## 2014-01-03 MED ORDER — OXYCODONE-ACETAMINOPHEN 10-325 MG PO TABS: 1.0000 | ORAL_TABLET | Freq: Three times a day (TID) | ORAL | Status: DC | PRN
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
## 2014-01-14 ENCOUNTER — Telehealth: Payer: Self-pay

## 2014-01-14 NOTE — Telephone Encounter (Signed)
Refill of cyclobenzaprine and oxyCODONE-acetaminophen.  °Patient also states she is awaiting her epidural injection and was told she would get in faster if she is no taking any blood thinners. Patient wants to know if there is anything she can take other than the meloxicam so that she can get a sooner appointment.  ° °

## 2014-01-14 NOTE — Telephone Encounter (Signed)
Refill of cyclobenzaprine and oxyCODONE-acetaminophen.  Patient also states she is awaiting her epidural injection and was told she would get in faster if she is no taking any blood thinners. Patient wants to know if there is anything she can take other than the meloxicam so that she can get a sooner appointment.

## 2014-01-16 MED ORDER — OXYCODONE-ACETAMINOPHEN 10-325 MG PO TABS: 1.0000 | ORAL_TABLET | Freq: Three times a day (TID) | ORAL | Status: DC | PRN

## 2014-01-16 MED ORDER — CYCLOBENZAPRINE HCL 10 MG PO TABS: 10.0000 mg | ORAL_TABLET | Freq: Three times a day (TID) | ORAL | Status: DC | PRN

## 2014-01-16 NOTE — Telephone Encounter (Signed)
Patient called to check the status of her medications "Cyclobenzaprine" and "Oxycodone" being refilled. I informed patient Dr. Merla Richesoolittle was not here on Friday nor this weekend. I let her know he would be back in the office tomorrow at 4 pm to close. Patient stated she would give it more time. Patients call back number is [phone]

## 2014-01-17 NOTE — Telephone Encounter (Signed)
LM to Advised pt they would be ready after 6pm today.

## 2014-02-01 ENCOUNTER — Telehealth: Payer: Self-pay

## 2014-02-01 NOTE — Telephone Encounter (Signed)
Pt needs a refill on cyclobenzaprine & oxycodone. [phone]

## 2014-02-02 ENCOUNTER — Ambulatory Visit: Payer: No Typology Code available for payment source | Admitting: Physical Therapy

## 2014-02-02 MED ORDER — OXYCODONE-ACETAMINOPHEN 10-325 MG PO TABS: 1.0000 | ORAL_TABLET | Freq: Three times a day (TID) | ORAL | Status: DC | PRN

## 2014-02-02 MED ORDER — CYCLOBENZAPRINE HCL 10 MG PO TABS: 10.0000 mg | ORAL_TABLET | Freq: Three times a day (TID) | ORAL | Status: DC | PRN

## 2014-02-02 NOTE — Telephone Encounter (Signed)
Meds ordered this encounter  Medications  . cyclobenzaprine (FLEXERIL) 10 MG tablet    Sig: Take 1 tablet (10 mg total) by mouth 3 (three) times daily as needed for muscle spasms.    Dispense:  30 tablet    Refill:  0  . oxyCODONE-acetaminophen (PERCOCET) 10-325 MG per tablet    Sig: Take 1 tablet by mouth every 8 (eight) hours as needed for pain.    Dispense:  30 tablet    Refill:  0

## 2014-02-03 NOTE — Telephone Encounter (Signed)
Actually VM was full and I could NOT LM. Will have to try to reach again.

## 2014-02-03 NOTE — Telephone Encounter (Signed)
Notified pt Rx is ready for p/up.

## 2014-02-03 NOTE — Telephone Encounter (Signed)
Reached pt and notified ready, and that her VM if full.

## 2014-02-15 ENCOUNTER — Encounter: Payer: Self-pay | Admitting: Physical Therapy

## 2014-02-15 ENCOUNTER — Ambulatory Visit: Payer: Self-pay | Attending: Orthopedic Surgery | Admitting: Physical Therapy

## 2014-02-15 ENCOUNTER — Telehealth: Payer: Self-pay | Admitting: *Deleted

## 2014-02-15 DIAGNOSIS — F909 Attention-deficit hyperactivity disorder, unspecified type: Secondary | ICD-10-CM | POA: Insufficient documentation

## 2014-02-15 DIAGNOSIS — M5126 Other intervertebral disc displacement, lumbar region: Secondary | ICD-10-CM | POA: Insufficient documentation

## 2014-02-15 DIAGNOSIS — M545 Low back pain: Secondary | ICD-10-CM | POA: Insufficient documentation

## 2014-02-15 NOTE — Telephone Encounter (Signed)
appts made and printed...td 
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
## 2014-02-15 NOTE — Patient Instructions (Signed)
Comfort Position: Flexion / Extension Spine Bias   Get ON TARGET. Lie on flat up roller, head on ball or roller at comfortable angle. Support with arms and feet. 1. Unsupported 2. In Flexion 3. In Extension  Elbow Prop (Extension)   Prop body up on elbows for ____ seconds. Slowly lower it. Repeat ____ times. Do ____ sessions per day.  Extension   Lie face down, hands close to chest. Press trunk up, arching back. Keep neck long, shoulders down. Tighten buttocks to protect lower back. Hold ____ seconds. Repeat ____ times. Do ____ sessions per day.  Backward Bend (Standing)   Arch backward to make hollow of back deeper. Hold ____ seconds. Repeat ____ times per set. Do ____ sets per session. Do ____ sessions per day.

## 2014-02-21 ENCOUNTER — Ambulatory Visit: Payer: Self-pay | Admitting: Rehabilitation

## 2014-02-21 DIAGNOSIS — M545 Low back pain: Secondary | ICD-10-CM

## 2014-02-21 NOTE — Therapy (Signed)
Physical Therapy Treatment  Patient Details  Name: Derwood Kaplanmily Lusty MRN: [id number] Date of Birth: 24-Nov-1980  Encounter Date: 02/21/2014      PT End of Session - 02/21/14 1359    Visit Number 2   Number of Visits 12   Date for PT Re-Evaluation 03/29/14   PT Start Time 0146      Past Medical History  Diagnosis Date  . Anemia   . Anxiety     Past Surgical History  Procedure Laterality Date  . Appendectomy    . Spine surgery      cervical  . Ankle surgery      There were no vitals taken for this visit.  Visit Diagnosis:  Bilateral low back pain, with sciatica presence unspecified      Subjective Assessment - 02/21/14 1356    Symptoms pt reports continued pain biateral legs and low back, pt reports laying on pillow in front of right hip and dropping left hip to bed helps decrease sx the most, otherwise painis unchanged   Currently in Pain? Yes   Pain Score 7    Pain Location Back   Pain Orientation Mid;Lower   Pain Radiating Towards --  bilateral LE, right more than left   Pain Onset More than a month ago   Pain Frequency Constant            [facility] Adult PT Treatment/Exercise - 02/21/14 0147    Exercises   Exercises Lumbar   Lumbar Exercises: Sidelying   Other Sidelying Lumbar Exercises --  Left sidelying over pillow, centralized lumbar at first   Other Sidelying Lumbar Exercises --  then reported RLE numbness, DC   Lumbar Exercises: Prone   Other Prone Lumbar Exercises --  Trials of lateral shifting, most relief with R shift however   Other Prone Lumbar Exercises --  not completely resolved   Modalities   Modalities Cryotherapy;Electrical Stimulation   Cryotherapy   Number Minutes Cryotherapy 15 Minutes   Cryotherapy Location Back   Type of Cryotherapy Ice pack   Electrical Stimulation   Electrical Stimulation Location --  Lumbar   Electrical Stimulation Action --  IFC   Electrical Stimulation Parameters --  to tolerance   Electrical Stimulation  Goals Pain     MECHANICAL LUMBAR TRACTION SUPINE WITH LEGS SLIGHTLY ELEVATED FIRST HALF OF TREATMENT, THEN AT 90/90 on BENCH FOR SECOND HALF OF TREATMENT DUE TO PT's SYMPTOMS IN LEFT LEG, NERVE GLIDES ENCOURAGED DURING TREATMENT 65# MAX PUll, 35# MIN PULL 60 SEC HOLD, 20 SEC RELEASE x12 MINUTES REPSONSE: Increased low back pain, mostly centralized , performed Estim/ice (see above) following traction. Response: decreased low back pain, assess benefit next visit           Problem List Patient Active Problem List   Diagnosis Date Noted  . Lumbar herniated disc 12/30/2013  . Neck pain 10/14/2012  . Musculoskeletal strain 10/14/2012  . ADHD (attention deficit hyperactivity disorder) 10/11/2011                                              Sherrie Mustacheonoho, Tilmon Wisehart McGee, PTA 02/21/2014, 4:23 PM

## 2014-02-28 ENCOUNTER — Ambulatory Visit: Payer: Self-pay | Admitting: Physical Therapy

## 2014-02-28 DIAGNOSIS — M545 Low back pain: Secondary | ICD-10-CM

## 2014-02-28 NOTE — Therapy (Signed)
Physical Therapy Treatment  Patient Details  Name: Regina Tyler MRN: [id number] Date of Birth: 04/13/1980  Encounter Date: 02/28/2014      PT End of Session - 02/28/14 1754    Visit Number 3   Number of Visits 12   Date for PT Re-Evaluation 03/29/14   PT Start Time 1333   PT Stop Time 1445   PT Time Calculation (min) 72 min   Activity Tolerance Patient limited by pain      Past Medical History  Diagnosis Date  . Anemia   . Anxiety     Past Surgical History  Procedure Laterality Date  . Appendectomy    . Spine surgery      cervical  . Ankle surgery      There were no vitals taken for this visit.  Visit Diagnosis:  Bilateral low back pain, with sciatica presence unspecified      Subjective Assessment - 02/28/14 1344    Symptoms 5.5/10.  Rt>Lt leg, to knee.  Hard to bear wright on Rt.  Stands with hips left.   Currently in Pain? Yes   Pain Score 6    Pain Location Leg  Both   Pain Orientation Mid;Lower   Pain Descriptors / Indicators Constant   Pain Onset More than a month ago   Pain Frequency Constant   Aggravating Factors  Weightbearing Rt leg   Pain Relieving Factors back brace with driving and playing music banjo, guitar.  Hands on hips with extension,   Multiple Pain Sites Yes   Pain Type Chronic pain   Pain Location Neck   Pain Orientation Left;Right   Pain Radiating Towards Lt>Rt   Pain Descriptors / Indicators Constant;Shooting;Aching   Pain Frequency Constant   Pain Onset On-going            [facility] Adult PT Treatment/Exercise - 02/28/14 1340    Lumbar Exercises: Sidelying   Other Sidelying Lumbar Exercises Mckenzie Lateral shift  Added to home exercise program   Modalities   Modalities Cryotherapy   Cryotherapy   Number Minutes Cryotherapy 10 Minutes   Cryotherapy Location Back   Type of Cryotherapy Ice pack   Manual Therapy   Other Manual Therapy Kinesiotex taping * pattern for HNP     Other treatment :  Mechanical traction.  New  machine protocol for disc involvement with muscle guarding used.  65 Lbs. Pull, 60 seconds holds, 20 LBS. Pull at 20 second rest.  Protocol has 6 ramps. Alternating with 1 ramp.  Legs straight due to her most comfortable position.  Stopped treatment early due to leg pain increase, not helped with 90/90 position.  Traction was Stand by assist.        PT Short Term Goals - 02/28/14 1800    PT SHORT TERM GOAL #1   Title Lumbar extension ROM improved to 19 degrees needed for upright posture with standing/walking at home and work.   Time 3   Period Weeks   Status On-going   PT SHORT TERM GOAL #2   Title Patient will report pain centralized 50% of the time.     Time 3   Period Weeks   Status On-going   PT SHORT TERM GOAL #3   Title Pain level on average decreased to 5/10 with modified home and work activities.     Time 3   Period Weeks   Status On-going            Plan - 02/28/14 1759  PT Next Visit Plan Assess taping.  Hold traction?  Assess traction with legs flat.  Try to get neutral spine.  may need more modalities.        Problem List Patient Active Problem List   Diagnosis Date Noted  . Lumbar herniated disc 12/30/2013  . Neck pain 10/14/2012  . Musculoskeletal strain 10/14/2012  . ADHD (attention deficit hyperactivity disorder) 10/11/2011                                             Liz BeachKaren Harris, PTA 02/28/2014 6:03 PM Phone: [phone] Fax: [phone]   HARRIS,KAREN 02/28/2014, 6:03 PM

## 2014-03-02 ENCOUNTER — Telehealth: Payer: Self-pay | Admitting: *Deleted

## 2014-03-02 ENCOUNTER — Telehealth: Payer: Self-pay

## 2014-03-02 ENCOUNTER — Ambulatory Visit: Payer: Self-pay | Attending: Orthopedic Surgery | Admitting: Rehabilitation

## 2014-03-02 DIAGNOSIS — F909 Attention-deficit hyperactivity disorder, unspecified type: Secondary | ICD-10-CM | POA: Insufficient documentation

## 2014-03-02 DIAGNOSIS — M545 Low back pain: Secondary | ICD-10-CM | POA: Insufficient documentation

## 2014-03-02 DIAGNOSIS — M5126 Other intervertebral disc displacement, lumbar region: Secondary | ICD-10-CM | POA: Insufficient documentation

## 2014-03-02 NOTE — Telephone Encounter (Signed)
Pt states she is in need of her MOBIC 15MG S, and her PERCOCET 10-325 MGS. Please call [phone]        [facility] [address]

## 2014-03-02 NOTE — Telephone Encounter (Signed)
APPTS MADE AND PRINTED...TD 

## 2014-03-03 NOTE — Therapy (Signed)
[facility] [address] [place], [place], [address] Phone: [phone]   Fax:  [phone]  Physical Therapy Treatment  Patient Details  Name: Regina Tyler MRN: [id number] Date of Birth: 22-Oct-1980  Encounter Date: 03/02/2014      PT End of Session - 03/02/14 1019    Visit Number 4   Number of Visits 12   Date for PT Re-Evaluation 03/29/14   PT Start Time 0940   PT Stop Time 1050   PT Time Calculation (min) 70 min      Past Medical History  Diagnosis Date  . Anemia   . Anxiety     Past Surgical History  Procedure Laterality Date  . Appendectomy    . Spine surgery      cervical  . Ankle surgery      There were no vitals taken for this visit.  Visit Diagnosis:  Bilateral low back pain, with sciatica presence unspecified      Subjective Assessment - 03/02/14 0955    Symptoms 7/10 pain, LB and R hip, feels increased pain to rt buttock today. Unale to tell a difference after traction last time, unsure if tape helpful   Pertinent History worse with bending and it's hard to feed the dog.  Best with lying with knees elevated.  Better with change of position.     Limitations Sitting;Walking   Currently in Pain? Yes   Pain Score 7    Pain Location Back  and rt buttock   Pain Orientation Mid;Lower   Pain Descriptors / Indicators Constant   Pain Radiating Towards --  bilateral LE right worse   Pain Onset More than a month ago   Aggravating Factors  weight bearing RT lef   Pain Relieving Factors stomach lying with pillow rt anterior hip   Multiple Pain Sites No            [facility] Adult PT Treatment/Exercise - 03/02/14 1014    Lumbar Exercises: Stretches   Piriformis Stretch --  Supine attempts at IR and ER stretching gently   Lumbar Exercises: Prone   Other Prone Lumbar Exercises --  Prone IR and ER isometrics with Manual resist. GENTLE x10 ea   Modalities   Modalities Traction  65# MAX 35 MIN LB PULL; 60/15 HOLD  /RELEASE TIME   Manual Therapy   Manual Therapy Passive ROM;Massage   Passive ROM --  Prone rt hip PROM IR/ER with TPR Right hip muscles   Other Manual Therapy --  KT tape for herniation as previous                Plan - 03/02/14 1033    Clinical Impression Statement Pt able to tolerate Prone traction with pillow under right hip for entire 15 minutes, pain centralized and increased low back pain, reduced by IFC and ice at end of treatment. Pt also reported centralizing symptoms during prone manual therapy to right hip musculature. 6/10 pain at end of treatment   PT Next Visit Plan continue manual to glutes, piriformis, prone traction if helpful                               Problem List Patient Active Problem List   Diagnosis Date Noted  . Lumbar herniated disc 12/30/2013  . Neck pain 10/14/2012  . Musculoskeletal strain 10/14/2012  . ADHD (attention deficit hyperactivity disorder) 10/11/2011    Sherrie MustacheDonoho, Nicklous Aburto McGee 03/03/2014, 2:37 PM

## 2014-03-03 NOTE — Therapy (Signed)
[facility] [address] [place], [place], [address] Phone: [phone]   Fax:  [phone]  Physical Therapy Treatment  Patient Details  Name: Derwood Kaplanmily Ide MRN: [id number] Date of Birth: 29-Dec-1980  Encounter Date: 03/02/2014      PT End of Session - 03/02/14 1019    Visit Number 4   Number of Visits 12   Date for PT Re-Evaluation 03/29/14   PT Start Time 0940   PT Stop Time 1050   PT Time Calculation (min) 70 min      Past Medical History  Diagnosis Date  . Anemia   . Anxiety     Past Surgical History  Procedure Laterality Date  . Appendectomy    . Spine surgery      cervical  . Ankle surgery      There were no vitals taken for this visit.  Visit Diagnosis:  Bilateral low back pain, with sciatica presence unspecified      Subjective Assessment - 03/02/14 0955    Symptoms 7/10 pain, LB and R hip, feels increased pain to rt buttock today. Unale to tell a difference after traction last time, unsure if tape helpful   Pertinent History worse with bending and it's hard to feed the dog.  Best with lying with knees elevated.  Better with change of position.     Limitations Sitting;Walking   Currently in Pain? Yes   Pain Score 7    Pain Location Back  and rt buttock   Pain Orientation Mid;Lower   Pain Descriptors / Indicators Constant   Pain Radiating Towards --  bilateral LE right worse   Pain Onset More than a month ago   Aggravating Factors  weight bearing RT lef   Pain Relieving Factors stomach lying with pillow rt anterior hip   Multiple Pain Sites No            [facility] Adult PT Treatment/Exercise - 03/02/14 1014    Lumbar Exercises: Stretches   Piriformis Stretch --  Supine attempts at IR and ER stretching gently   Lumbar Exercises: Prone   Other Prone Lumbar Exercises --  Prone IR and ER isometrics with Manual resist. GENTLE x10 ea   Modalities   Modalities Traction  65# MAX 35 MIN LB PULL; 60/15 HOLD  /RELEASE TIME   Manual Therapy   Manual Therapy Passive ROM;Massage   Passive ROM --  Prone rt hip PROM IR/ER with TPR Right hip muscles   Other Manual Therapy --  KT tape for herniation as previous                Plan - 03/02/14 1033    Clinical Impression Statement Pt able to tolerate Prone traction with pillow under right hip for entire 15 minutes, pain centralized and increased low back pain, reduced by IFC and ice at end of treatment. Pt also reported centralizing symptoms during prone manual therapy to right hip musculature. 6/10 pain at end of treatment   PT Next Visit Plan continue manual to glutes, piriformis, prone traction if helpful                               Problem List Patient Active Problem List   Diagnosis Date Noted  . Lumbar herniated disc 12/30/2013  . Neck pain 10/14/2012  . Musculoskeletal strain 10/14/2012  . ADHD (attention deficit hyperactivity disorder) 10/11/2011    Sherrie MustacheDonoho, AmeLie Hollars McGee 03/03/2014, 2:41 PM

## 2014-03-03 NOTE — Therapy (Signed)
[facility] [address] [place], [place], [address] Phone: [phone]   Fax:  [phone]  Physical Therapy Treatment  Patient Details  Name: Derwood Kaplanmily Nez MRN: [id number] Date of Birth: 07-11-1980  Encounter Date: 03/02/2014      PT End of Session - 03/02/14 1019    Visit Number 4   Number of Visits 12   Date for PT Re-Evaluation 03/29/14   PT Start Time 0940   PT Stop Time 1050   PT Time Calculation (min) 70 min      Past Medical History  Diagnosis Date  . Anemia   . Anxiety     Past Surgical History  Procedure Laterality Date  . Appendectomy    . Spine surgery      cervical  . Ankle surgery      There were no vitals taken for this visit.  Visit Diagnosis:  Bilateral low back pain, with sciatica presence unspecified      Subjective Assessment - 03/02/14 0955    Symptoms 7/10 pain, LB and R hip, feels increased pain to rt buttock today. Unale to tell a difference after traction last time, unsure if tape helpful   Pertinent History worse with bending and it's hard to feed the dog.  Best with lying with knees elevated.  Better with change of position.     Limitations Sitting;Walking   Currently in Pain? Yes   Pain Score 7    Pain Location Back  and rt buttock   Pain Orientation Mid;Lower   Pain Descriptors / Indicators Constant   Pain Radiating Towards --  bilateral LE right worse   Pain Onset More than a month ago   Aggravating Factors  weight bearing RT lef   Pain Relieving Factors stomach lying with pillow rt anterior hip   Multiple Pain Sites No            [facility] Adult PT Treatment/Exercise - 03/02/14 1014    Lumbar Exercises: Stretches   Piriformis Stretch --  Supine attempts at IR and ER stretching gently   Lumbar Exercises: Prone   Other Prone Lumbar Exercises --  Prone IR and ER isometrics with Manual resist. GENTLE x10 ea   Modalities   Modalities Traction  65# MAX 35 MIN LB PULL; 60/15 HOLD  /RELEASE TIME   Manual Therapy   Manual Therapy Passive ROM;Massage   Passive ROM --  Prone rt hip PROM IR/ER with TPR Right hip muscles   Other Manual Therapy --  KT tape for herniation as previous                Plan - 03/02/14 1033    Clinical Impression Statement Pt able to tolerate Prone traction with pillow under right hip for entire 15 minutes, pain centralized and increased low back pain, reduced by IFC and ice at end of treatment. Pt also reported centralizing symptoms during prone manual therapy to right hip musculature. 6/10 pain at end of treatment   PT Next Visit Plan continue manual to glutes, piriformis, prone traction if helpful                               Problem List Patient Active Problem List   Diagnosis Date Noted  . Lumbar herniated disc 12/30/2013  . Neck pain 10/14/2012  . Musculoskeletal strain 10/14/2012  . ADHD (attention deficit hyperactivity disorder) 10/11/2011    Sherrie MustacheDonoho, Eron Goble McGee 03/03/2014, 2:40 PM

## 2014-03-04 MED ORDER — OXYCODONE-ACETAMINOPHEN 10-325 MG PO TABS: 1.0000 | ORAL_TABLET | Freq: Three times a day (TID) | ORAL | Status: DC | PRN

## 2014-03-04 MED ORDER — MELOXICAM 15 MG PO TABS: 15.0000 mg | ORAL_TABLET | Freq: Every day | ORAL | Status: DC

## 2014-03-04 NOTE — Telephone Encounter (Signed)
Notified pt on VM that Rx is ready for p/up

## 2014-03-07 ENCOUNTER — Ambulatory Visit: Payer: Self-pay | Admitting: Physical Therapy

## 2014-03-07 DIAGNOSIS — M545 Low back pain: Secondary | ICD-10-CM

## 2014-03-07 NOTE — Patient Instructions (Signed)
For prone exercises, try looking at feet to keep pain from increasing in neck.

## 2014-03-07 NOTE — Therapy (Signed)
Outpatient [facility] [address] [place], [place], [address] Phone: [phone]   Fax:  [phone]  Physical Therapy Treatment  Patient Details  Name: Regina Tyler MRN: [id number] Date of Birth: 1980/04/15  Encounter Date: 03/07/2014      PT End of Session - 03/07/14 1143    Visit Number 5   Number of Visits 12   Date for PT Re-Evaluation 03/29/14   PT Start Time 1032   PT Stop Time 1140   PT Time Calculation (min) 68 min   Activity Tolerance Patient limited by pain      Past Medical History  Diagnosis Date  . Anemia   . Anxiety     Past Surgical History  Procedure Laterality Date  . Appendectomy    . Spine surgery      cervical  . Ankle surgery      There were no vitals taken for this visit.  Visit Diagnosis:  Bilateral low back pain, with sciatica presence unspecified      Subjective Assessment - 03/07/14 1051    Symptoms 6/10. more centralized.  Able to walk faster.  Neck a little worse with prone traction..  wants to do supine traction today.. 6/10 neck into Lt elbow   Pertinent History pain increases to more than minimally at 20-25 min.  Standing increases to more than minimal at 10 minutes at the most.  Pain centralized 30% of the time (when she is laying down)   Currently in Pain? Yes   Pain Score 6             [facility] Adult PT Treatment/Exercise - 03/07/14 1045    Modalities   Modalities Traction;Moist Heat;Electrical Stimulation  increased to 70 Lbs.  Protocoldisc involvement with muscle g   Moist Heat Therapy   Number Minutes Moist Heat 15 Minutes   Moist Heat Location --  low back gluteals.  Prone pillow and 2 pillows used for comf   Programme researcher, broadcasting/film/videolectrical Stimulation   Electrical Stimulation Location low back, gluteals            PT Short Term Goals - 03/07/14 1041    PT SHORT TERM GOAL #2   Title Patient will report pain centralized 50% of the time.     Time 3   Status On-going   PT SHORT TERM GOAL #3   Title  Pain level on average decreased to 5/10 with modified home and work activities.     Time 3   Status On-going          PT Long Term Goals - 03/07/14 1042    PT LONG TERM GOAL #3   Title "Pt will tolerate sitting 1 hour with minimal increased pain to perform work duties.   Time 6   Period Weeks   Status On-going   PT LONG TERM GOAL #4   Title "Pt will tolerate walking for 1 hour with minimal increased pain in order to return to PLOF and work    Time 6   Status On-going   PT LONG TERM GOAL #5   Title Modified Oswestry score improved from 60% self perceived disability to 48% indicating improved function with less pain   Time 6   Period Weeks   Status On-going          Plan - 03/07/14 1145    Clinical Impression Statement Patient not meeting any new goals.  She reports she can walk a little bit easier.  Patient 15 minutes late today.  She reports her pain is centralized when she is lying down.  Walks 10 minutes at the most.     PT Next Visit Plan Manual to low back, gluteals if patient is on time.  Traction                               Problem List Patient Active Problem List   Diagnosis Date Noted  . Lumbar herniated disc 12/30/2013  . Neck pain 10/14/2012  . Musculoskeletal strain 10/14/2012  . ADHD (attention deficit hyperactivity disorder) 10/11/2011   Liz BeachKaren Harris, PTA 03/07/2014 11:49 AM Phone: [phone] Fax: [phone]   HARRIS,KAREN 03/07/2014, 11:49 AM

## 2014-03-10 ENCOUNTER — Other Ambulatory Visit: Payer: Self-pay | Admitting: Internal Medicine

## 2014-03-10 ENCOUNTER — Ambulatory Visit: Payer: Self-pay | Admitting: Physical Therapy

## 2014-03-10 DIAGNOSIS — M545 Low back pain: Secondary | ICD-10-CM

## 2014-03-10 NOTE — Therapy (Addendum)
Outpatient Rehabilitation [facility] [address] [place], [place], [address] Phone: [phone]   Fax:  [phone]  Physical Therapy Treatment/Discharge Summary  Patient Details  Name: Regina Tyler MRN: [id number] Date of Birth: 05/17/1980  Encounter Date: 03/10/2014      PT End of Session - 03/10/14 1204    Visit Number 6   Number of Visits 12   Date for PT Re-Evaluation 03/29/14   PT Start Time 9323   PT Stop Time 1225   PT Time Calculation (min) 74 min      Past Medical History  Diagnosis Date  . Anemia   . Anxiety     Past Surgical History  Procedure Laterality Date  . Appendectomy    . Spine surgery      cervical  . Ankle surgery      There were no vitals taken for this visit.  Visit Diagnosis:  Bilateral low back pain, with sciatica presence unspecified      Subjective Assessment - 03/10/14 1114    Symptoms Arrives 11 min late.  Right lateral shift.    LBP 6-7/10 Left > right currently;   Bilateral LE pain 5/10.  LE pain mostly constant but can relieve with just the right position lying down.  MD appt after Christmas.     Currently in Pain? Yes   Pain Location Back   Pain Orientation Left   Pain Radiating Towards both legs to lateral mid thigh   Aggravating Factors  sitting   Pain Relieving Factors lying down prone but it's hard on the neck;   change position;  some relief from traction for a few days            [facility] Adult PT Treatment/Exercise - 03/10/14 1135    Lumbar Exercises: [employer]   Press Ups 3 reps   Press Ups Limitations --  Discontinued secondary to increased pain.     Lumbar Exercises: Standing   Other Standing Lumbar Exercises --  Standing lateral glide correction 8x   Lumbar Exercises: Sidelying   Other Sidelying Lumbar Exercises --  Sidelying over folded pillow right and left 3 min each   Other Sidelying Lumbar Exercises --  Both peripheralized so discontinued   Lumbar Exercises: Prone   Other Prone Lumbar  Exercises --  Prone with hips offset left peripheralized so discontinued   Modalities   Modalities Traction  Lumbar 70# max, 50# min supine 15 min   Cryotherapy   Number Minutes Cryotherapy 15 Minutes   Cryotherapy Location Back   Type of Cryotherapy Ice pack   Electrical Stimulation   Electrical Stimulation Location low back, gluteals   Electrical Stimulation Parameters --  IFC 12 ma   Electrical Stimulation Goals Pain            PT Short Term Goals - 03/10/14 1225    PT SHORT TERM GOAL #1   Title Lumbar extension ROM improved to 19 degrees needed for upright posture with standing/walking at home and work.   Time 3   Period Weeks   Status On-going   PT SHORT TERM GOAL #2   Title Patient will report pain centralized 50% of the time.     Time 3   Period Weeks   PT SHORT TERM GOAL #3   Title Pain level on average decreased to 5/10 with modified home and work activities.     Time 3   Period Weeks   Status On-going  PT Long Term Goals - 03/10/14 1225    PT LONG TERM GOAL #1   Title "Demonstrate and verbalize techniques to reduce the risk of re-injury including: lifting, posture, body mechanics   Time 6   Period Weeks   Status On-going   PT LONG TERM GOAL #2   Title "Pt will be independent with advanced HEP   Time 6   Period Weeks   PT LONG TERM GOAL #3   Title "Pt will tolerate sitting 1 hour with minimal increased pain to perform work duties.   Period Weeks   Status On-going   PT LONG TERM GOAL #4   Title "Pt will tolerate walking for 1 hour with minimal increased pain in order to return to PLOF and work    Time 6   Period Weeks   Status On-going   PT LONG TERM GOAL #5   Title Modified Oswestry score improved from 60% self perceived disability to 48% indicating improved function with less pain   Time 6   Period Weeks   Status On-going          Plan - 03/10/14 1205    Clinical Impression Statement Positive right lateral shift with bilateral  LE symptoms.  With attempted shift correction in standing, sidelying or prone, pain peripheralizes to LE.  Extension peripheralizes as well.  Lumbar traction provides some relief per patient report although back "soreness" is usually increased initially.  No progress toward goals at this point.     PT Next Visit Plan Unable to centralize symptoms with exercise and postioning.  Patient will assess benefit from traction over the next few days.  If helpful, she will continue 2x next week.  If no better she will call to cancel, hold PT and follow up with her doctor.              PHYSICAL THERAPY DISCHARGE SUMMARY  Visits from Start of Care: 6  Current functional level related to goals / functional outcomes: See clinical impression statement above.  No improvement over 6 visits.  Patient called and cancelled her last scheduled appointments to follow up with MD.     Remaining deficits: As above   Education / Equipment: Self care, basic HEP Plan: Patient agrees to discharge.  Patient goals were not met. Patient is being discharged due to lack of progress.  ?????                        Problem List Patient Active Problem List   Diagnosis Date Noted  . Lumbar herniated disc 12/30/2013  . Neck pain 10/14/2012  . Musculoskeletal strain 10/14/2012  . ADHD (attention deficit hyperactivity disorder) 10/11/2011    Alvera Singh 03/10/2014, 1:11 PM  Ruben Im, PT 03/10/2014 1:12 PM Phone: [phone] Fax: [phone]

## 2014-03-14 ENCOUNTER — Ambulatory Visit: Payer: Self-pay | Admitting: Physical Therapy

## 2014-03-17 ENCOUNTER — Ambulatory Visit: Payer: No Typology Code available for payment source | Admitting: Rehabilitation

## 2014-03-28 ENCOUNTER — Other Ambulatory Visit: Payer: Self-pay

## 2014-03-28 NOTE — Telephone Encounter (Signed)
Pt requesting refill on oxyCODONE-acetaminophen (PERCOCET) 10-325 MG per tablet [[id number]][[id number]]

## 2014-03-29 MED ORDER — OXYCODONE-ACETAMINOPHEN 10-325 MG PO TABS: 1.0000 | ORAL_TABLET | Freq: Three times a day (TID) | ORAL | Status: DC | PRN

## 2014-03-29 NOTE — Telephone Encounter (Signed)
Mailbox still full.

## 2014-03-29 NOTE — Telephone Encounter (Signed)
Rx in drawer. VM full couldn't LM.

## 2014-03-30 NOTE — Telephone Encounter (Signed)
[facility]

## 2014-04-05 ENCOUNTER — Other Ambulatory Visit: Payer: Self-pay | Admitting: Internal Medicine

## 2014-05-11 ENCOUNTER — Telehealth: Payer: Self-pay

## 2014-05-11 DIAGNOSIS — F900 Attention-deficit hyperactivity disorder, predominantly inattentive type: Secondary | ICD-10-CM

## 2014-05-11 NOTE — Telephone Encounter (Signed)
Pt requesting a refill on her ADDERALL 20mg s, MOBIC 15mg s and PERCOCET 10-325 mgs. Please call [phone] when ready for pick up

## 2014-05-12 NOTE — Telephone Encounter (Signed)
Would one of you please sign these 2 prescriptions for Derwood Kaplanmily Cody  Also please give me some help--I will leave tonight for a wedding in [place] and will not fly back until Monday night Very unlikely that I'll be able to check the computer while away Feel free to call my cell phone if you have any questions [phone]

## 2014-05-12 NOTE — Telephone Encounter (Signed)
Should be okay to fill although I will not be back in the office until Tuesday so ask a PA to sign please

## 2014-05-12 NOTE — Telephone Encounter (Signed)
Pt should have 2 RFs of mobic at pharm. I have pended the other two for review.

## 2014-05-13 MED ORDER — AMPHETAMINE-DEXTROAMPHETAMINE 20 MG PO TABS: 20.0000 mg | ORAL_TABLET | Freq: Three times a day (TID) | ORAL | Status: DC

## 2014-05-13 MED ORDER — OXYCODONE-ACETAMINOPHEN 10-325 MG PO TABS: 1.0000 | ORAL_TABLET | Freq: Three times a day (TID) | ORAL | Status: DC | PRN

## 2014-05-13 NOTE — Telephone Encounter (Signed)
Notified pt 2 Rxs ready here and [facility]

## 2014-05-13 NOTE — Telephone Encounter (Signed)
Ready for pick up

## 2014-06-10 ENCOUNTER — Telehealth: Payer: Self-pay

## 2014-06-10 DIAGNOSIS — F900 Attention-deficit hyperactivity disorder, predominantly inattentive type: Secondary | ICD-10-CM

## 2014-06-10 MED ORDER — OXYCODONE-ACETAMINOPHEN 10-325 MG PO TABS: 1.0000 | ORAL_TABLET | Freq: Three times a day (TID) | ORAL | Status: DC | PRN

## 2014-06-10 MED ORDER — CYCLOBENZAPRINE HCL 10 MG PO TABS: 10.0000 mg | ORAL_TABLET | Freq: Three times a day (TID) | ORAL | Status: DC | PRN

## 2014-06-10 MED ORDER — AMPHETAMINE-DEXTROAMPHETAMINE 20 MG PO TABS: 20.0000 mg | ORAL_TABLET | Freq: Three times a day (TID) | ORAL | Status: DC

## 2014-06-10 MED ORDER — MELOXICAM 15 MG PO TABS: 15.0000 mg | ORAL_TABLET | Freq: Every day | ORAL | Status: DC

## 2014-06-10 NOTE — Telephone Encounter (Signed)
Pt would like a refill on amphetamine-dextroamphetamine (ADDERALL) 20 MG tablet [[id number]][[id number]] ,cyclobenzaprine (FLEXERIL) 10 MG tablet [[id number]],oxyCODONE-acetaminophen (PERCOCET) 10-325 MG per tablet [[id number]]NWG[[id number]]and meloxicam (MOBIC) 15 MG tablet [[id number]][[id number]] Please advise at [phone]

## 2014-06-10 NOTE — Telephone Encounter (Signed)
I've refilled all of those medications for the patient. I've refilled the adderall for one month (90 pills), and for the others have given 20 pills with no refills. She will need to see Dr DooliMerla Richesttle for any further refills from us. The prescriptions will be at clinic waiting for her.

## 2014-06-10 NOTE — Telephone Encounter (Signed)
Can someone review for Dr. Merla Richesoolittle?

## 2014-06-14 NOTE — Telephone Encounter (Signed)
Called pt, Mailbox is full.

## 2014-08-16 ENCOUNTER — Telehealth: Payer: Self-pay

## 2014-08-16 ENCOUNTER — Other Ambulatory Visit: Payer: Self-pay | Admitting: Internal Medicine

## 2014-08-16 MED ORDER — OXYCODONE-ACETAMINOPHEN 10-325 MG PO TABS: 1.0000 | ORAL_TABLET | Freq: Three times a day (TID) | ORAL | Status: DC | PRN

## 2014-08-16 NOTE — Telephone Encounter (Signed)
Refill for Percocet requested!   Best phone for pt is [phone]

## 2014-08-16 NOTE — Telephone Encounter (Signed)
I have written her for Percocet.  I would like for her to make an appt with Dr Merla Richesoolittle as she has not been seen for her back since 12/2013 and she has been going to PT but we need to recheck esp since she is still needing pain medication.

## 2014-08-17 NOTE — Telephone Encounter (Signed)
Advised pt that Rx is ready and need for f/up. Pt agreed and stated she had called a few days ago for Dr Doolittle's sch and found out he is out of country. Transferred to Sch for appt.

## 2014-08-27 ENCOUNTER — Ambulatory Visit (INDEPENDENT_AMBULATORY_CARE_PROVIDER_SITE_OTHER): Payer: PRIVATE HEALTH INSURANCE | Admitting: Emergency Medicine

## 2014-08-27 VITALS — BP 100/70 | HR 82 | Temp 97.4°F | Ht 67.75 in | Wt 189.5 lb

## 2014-08-27 DIAGNOSIS — F900 Attention-deficit hyperactivity disorder, predominantly inattentive type: Secondary | ICD-10-CM

## 2014-08-27 DIAGNOSIS — H18822 Corneal disorder due to contact lens, left eye: Secondary | ICD-10-CM | POA: Diagnosis not present

## 2014-08-27 MED ORDER — HYDROCODONE-ACETAMINOPHEN 5-325 MG PO TABS: 1.0000 | ORAL_TABLET | ORAL | Status: DC | PRN

## 2014-08-27 MED ORDER — AMPHETAMINE-DEXTROAMPHETAMINE 20 MG PO TABS: 20.0000 mg | ORAL_TABLET | Freq: Three times a day (TID) | ORAL | Status: DC

## 2014-08-27 MED ORDER — TOBRAMYCIN 0.3 % OP SOLN: 1.0000 [drp] | OPHTHALMIC | Status: DC

## 2014-08-27 NOTE — Progress Notes (Signed)
Subjective:  Patient ID: Regina Tyler, female    DOB: 07/27/1980  Age: 34 y.o. MRN: [id number]  CC: Eye Problem; Medication Refill; Nasal Congestion; and Nausea   HPI Regina Tyler presents  with pain and redness in her left eye. She wears extended wear contact lenses and has had her current lenses in for 3 or 4 weeks by her estimation. 18 and redness in her left eye and continued to wear her contacts yesterday she had some drainage. Feels so she has something "in her eye." She has no visual disturbance in her left eye. Since taking her contact lenses out she is continuing to experience pain in her left eye. She has injection. She has no cough or coryza. No fever or chills. No history of foreign body or injury.  Outpatient Prescriptions Prior to Visit  Medication Sig Dispense Refill  . cyclobenzaprine (FLEXERIL) 10 MG tablet Take 1 tablet (10 mg total) by mouth 3 (three) times daily as needed. for muscle spams 20 tablet 0  . meloxicam (MOBIC) 15 MG tablet Take 1 tablet (15 mg total) by mouth daily. 20 tablet 0  . amphetamine-dextroamphetamine (ADDERALL) 20 MG tablet Take 1 tablet (20 mg total) by mouth 3 (three) times daily. 90 tablet 0  . diazepam (VALIUM) 5 MG tablet Take 1 tablet (5 mg total) by mouth every 8 (eight) hours as needed for muscle spasms. (Patient not taking: Reported on 08/27/2014) 15 tablet 0  . ibuprofen (ADVIL,MOTRIN) 800 MG tablet Take 1 tablet (800 mg total) by mouth every 8 (eight) hours as needed for mild pain. (Patient not taking: Reported on 08/27/2014) 30 tablet 0  . oxyCODONE-acetaminophen (PERCOCET) 10-325 MG per tablet Take 1 tablet by mouth every 8 (eight) hours as needed for pain. (Patient not taking: Reported on 08/27/2014) 20 tablet 0  . meloxicam (MOBIC) 15 MG tablet TAKE ONE TABLET BY MOUTH ONCE DAILY 30 tablet 0  . methylPREDNIsolone (MEDROL DOSPACK) 4 MG tablet follow package directions 21 tablet 0  . predniSONE (DELTASONE) 20 MG tablet 4/3/3/2/2/1/1 single daily  dose for 7 days 16 tablet 0   No facility-administered medications prior to visit.    History   Social History  . Marital Status: Married    Spouse Name: [place]  . Number of Children: 0  . Years of Education: College   Occupational History  . musician   . yoga   .     Social History Main Topics  . Smoking status: Never Smoker   . Smokeless tobacco: Never Used  . Alcohol Use: 0.0 oz/week    0 Standard drinks or equivalent per week     Comment: 2-3 x wk, 1-2 beer or wine  . Drug Use: No  . Sexual Activity:    Partners: Male    Birth Control/ Protection: IUD   Other Topics Concern  . None   Social History Narrative   Patient lives at home with spouse.   Caffeine use: 2-3 cups daily    Family History  Problem Relation Age of Onset  . Cancer Mother     breast  . Cancer Maternal Grandmother     uterine  . Cancer Maternal Grandfather     blood  . Hypertension Paternal Grandmother   . Heart disease Paternal Grandmother     CHF  . Parkinson's disease Paternal Grandfather     Past Medical History  Diagnosis Date  . Anemia   . Anxiety      Review of Systems  Constitutional: Negative for fever, chills and appetite change.  HENT: Negative for congestion, ear pain, postnasal drip, sinus pressure and sore throat.   Eyes: Positive for photophobia, pain, discharge and redness. Negative for itching and visual disturbance.  Respiratory: Negative for cough, shortness of breath and wheezing.   Cardiovascular: Negative for leg swelling.  Gastrointestinal: Negative for nausea, vomiting, abdominal pain, diarrhea, constipation and blood in stool.  Endocrine: Negative for polyuria.  Genitourinary: Negative for dysuria, urgency, frequency and flank pain.  Musculoskeletal: Negative for gait problem.  Skin: Negative for rash.  Neurological: Negative for weakness and headaches.  Psychiatric/Behavioral: Negative for confusion and decreased concentration. The patient is not  nervous/anxious.     Objective:  BP 100/70 mmHg  Pulse 82  Temp(Src) 97.4 F (36.3 C) (Oral)  Ht 5' 7.75" (1.721 m)  Wt 189 lb 8 oz (85.957 kg)  BMI 29.02 kg/m2  SpO2 99%  BP Readings from Last 3 Encounters:  08/27/14 100/70  01/03/14 118/84  12/30/13 118/84    Wt Readings from Last 3 Encounters:  08/27/14 189 lb 8 oz (85.957 kg)  01/03/14 184 lb 12.8 oz (83.825 kg)  12/05/13 184 lb (83.462 kg)    Physical Exam  Constitutional: She is oriented to person, place, and time. She appears well-developed and well-nourished.  HENT:  Head: Normocephalic and atraumatic.  Eyes: Left eye exhibits no chemosis, no discharge, no exudate and no hordeolum. No foreign body present in the left eye. Left conjunctiva is injected. Left conjunctiva has no hemorrhage. No scleral icterus. Left eye exhibits normal extraocular motion and no nystagmus. Left pupil is round. Pupils are equal.  Pulmonary/Chest: Effort normal.  Musculoskeletal: She exhibits no edema.  Neurological: She is alert and oriented to person, place, and time.  Skin: Skin is dry.  Psychiatric: She has a normal mood and affect. Her behavior is normal. Thought content normal.    No results found for: WBC, HGB, HCT, PLT, GLUCOSE, CHOL, TRIG, HDL, LDLDIRECT, LDLCALC, ALT, AST, NA, K, CL, CREATININE, BUN, CO2, TSH, PSA, INR, GLUF, HGBA1C, MICROALBUR    .  Assessment & Plan:   Gailya was seen today for eye problem, medication refill, nasal congestion and nausea.  Diagnoses and all orders for this visit:  Corneal abrasion due to contact lens, left  Attention deficit hyperactivity disorder (ADHD), predominantly inattentive type Orders: -     amphetamine-dextroamphetamine (ADDERALL) 20 MG tablet; Take 1 tablet (20 mg total) by mouth 3 (three) times daily.  Other orders -     HYDROcodone-acetaminophen (NORCO) 5-325 MG per tablet; Take 1-2 tablets by mouth every 4 (four) hours as needed. -     tobramycin (TOBREX) 0.3 %  ophthalmic solution; Place 1 drop into the left eye every 4 (four) hours.   I have discontinued Ms. Douglas's methylPREDNIsolone and predniSONE. I am also having her start on HYDROcodone-acetaminophen and tobramycin. Additionally, I am having her maintain her ibuprofen, diazepam, meloxicam, cyclobenzaprine, oxyCODONE-acetaminophen, and amphetamine-dextroamphetamine.  Meds ordered this encounter  Medications  . HYDROcodone-acetaminophen (NORCO) 5-325 MG per tablet    Sig: Take 1-2 tablets by mouth every 4 (four) hours as needed.    Dispense:  10 tablet    Refill:  0  . tobramycin (TOBREX) 0.3 % ophthalmic solution    Sig: Place 1 drop into the left eye every 4 (four) hours.    Dispense:  5 mL    Refill:  0  . amphetamine-dextroamphetamine (ADDERALL) 20 MG tablet    Sig: Take 1 tablet (  20 mg total) by mouth 3 (three) times daily.    Dispense:  90 tablet    Refill:  0    Appropriate red flag conditions were discussed with the patient as well as actions that should be taken.  Patient expressed his understanding.  She was instructed to follow-up with her ophthalmologist on Monday and follow-up here tomorrow if she's had no improvement by midday or certainly is a possibility of uveitis or other issues related to her eye. On fluorescein examination there is no evidence of abrasion. No visible foreign body  Follow-up: Return in about 1 day (around 08/28/2014), or if symptoms worsen or fail to improve.  Carmelina DaneAnderson, Naser Schuld S, MD

## 2014-08-27 NOTE — Patient Instructions (Signed)
Corneal Abrasion °The cornea is the clear covering at the front and center of the eye. When looking at the colored portion of the eye (iris), you are looking through the cornea. This very thin tissue is made up of many layers. The surface layer is a single layer of cells (corneal epithelium) and is one of the most sensitive tissues in the body. If a scratch or injury causes the corneal epithelium to come off, it is called a corneal abrasion. If the injury extends to the tissues below the epithelium, the condition is called a corneal ulcer. °CAUSES  °· Scratches. °· Trauma. °· Foreign body in the eye. °Some people have recurrences of abrasions in the area of the original injury even after it has healed (recurrent erosion syndrome). Recurrent erosion syndrome generally improves and goes away with time. °SYMPTOMS  °· Eye pain. °· Difficulty or inability to keep the injured eye open. °· The eye becomes very sensitive to light. °· Recurrent erosions tend to happen suddenly, first thing in the morning, usually after waking up and opening the eye. °DIAGNOSIS  °Your health care provider can diagnose a corneal abrasion during an eye exam. Dye is usually placed in the eye using a drop or a small paper strip moistened by your tears. When the eye is examined with a special light, the abrasion shows up clearly because of the dye. °TREATMENT  °· Small abrasions may be treated with antibiotic drops or ointment alone. °· A pressure patch may be put over the eye. If this is done, follow your doctor's instructions for when to remove the patch. Do not drive or use machines while the eye patch is on. Judging distances is hard to do with a patch on. °If the abrasion becomes infected and spreads to the deeper tissues of the cornea, a corneal ulcer can result. This is serious because it can cause corneal scarring. Corneal scars interfere with light passing through the cornea and cause a loss of vision in the involved eye. °HOME CARE  INSTRUCTIONS °· Use medicine or ointment as directed. Only take over-the-counter or prescription medicines for pain, discomfort, or fever as directed by your health care provider. °· Do not drive or operate machinery if your eye is patched. Your ability to judge distances is impaired. °· If your health care provider has given you a follow-up appointment, it is very important to keep that appointment. Not keeping the appointment could result in a severe eye infection or permanent loss of vision. If there is any problem keeping the appointment, let your health care provider know. °SEEK MEDICAL CARE IF:  °· You have pain, light sensitivity, and a scratchy feeling in one eye or both eyes. °· Your pressure patch keeps loosening up, and you can blink your eye under the patch after treatment. °· Any kind of discharge develops from the eye after treatment or if the lids stick together in the morning. °· You have the same symptoms in the morning as you did with the original abrasion days, weeks, or months after the abrasion healed. °MAKE SURE YOU:  °· Understand these instructions. °· Will watch your condition. °· Will get help right away if you are not doing well or get worse. °Document Released: 03/15/2000 Document Revised: 03/23/2013 Document Reviewed: 11/23/2012 °Exit[employer]® Patient Information ©2015 [employer]. This information is not intended to replace advice given to you by your health care provider. Make sure you discuss any questions you have with your health care provider. ° °

## 2014-10-03 ENCOUNTER — Ambulatory Visit (INDEPENDENT_AMBULATORY_CARE_PROVIDER_SITE_OTHER): Payer: PRIVATE HEALTH INSURANCE | Admitting: Internal Medicine

## 2014-10-03 VITALS — BP 118/82 | HR 84 | Temp 98.4°F | Resp 18

## 2014-10-03 DIAGNOSIS — M545 Low back pain: Secondary | ICD-10-CM

## 2014-10-03 DIAGNOSIS — G8929 Other chronic pain: Secondary | ICD-10-CM | POA: Diagnosis not present

## 2014-10-03 DIAGNOSIS — F900 Attention-deficit hyperactivity disorder, predominantly inattentive type: Secondary | ICD-10-CM | POA: Diagnosis not present

## 2014-10-03 DIAGNOSIS — M5126 Other intervertebral disc displacement, lumbar region: Secondary | ICD-10-CM

## 2014-10-03 MED ORDER — MELOXICAM 15 MG PO TABS: 15.0000 mg | ORAL_TABLET | Freq: Every day | ORAL | Status: DC

## 2014-10-03 MED ORDER — OXYCODONE-ACETAMINOPHEN 10-325 MG PO TABS: 1.0000 | ORAL_TABLET | Freq: Every evening | ORAL | Status: DC | PRN

## 2014-10-03 MED ORDER — AMPHETAMINE-DEXTROAMPHETAMINE 20 MG PO TABS: 20.0000 mg | ORAL_TABLET | Freq: Three times a day (TID) | ORAL | Status: DC

## 2014-10-03 MED ORDER — CYCLOBENZAPRINE HCL 10 MG PO TABS: 10.0000 mg | ORAL_TABLET | Freq: Three times a day (TID) | ORAL | Status: DC | PRN

## 2014-10-03 NOTE — Patient Instructions (Signed)
[facility]  Charles L. Gildardo GriffesBranch Jr, MD Neurosurgery  Morey Hummingbirdaniel E. Couture, MD Neurosurgery  Mindi JunkerWesley Hsu, MD Neurosurgery  Patrick NorthAlexander K. Powers, MD Neurosurgery  John A. Andrey CampanileWilson, MD Neurosurgery  Janalyn RouseAmit H. Patel, MD Physical Medicine & Rehabilitation  A Rosie Placeongtao Velia MeyerMichael Guo, MD Physical Medicine & Rehabilitation  Chestine SporeJohn Peter Birkedal, MD Orthopaedics  Tadhg Charmian MuffJames O'Gara, MD Orthopaedics  Jacki ConesJohn Frino, MD Pediatric Orthopaedics   Lawyer Jillyn HiddenGary Sue--on [address]
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
## 2014-11-23 ENCOUNTER — Encounter: Payer: Self-pay | Admitting: Certified Nurse Midwife

## 2014-11-23 ENCOUNTER — Ambulatory Visit (INDEPENDENT_AMBULATORY_CARE_PROVIDER_SITE_OTHER): Payer: PRIVATE HEALTH INSURANCE | Admitting: Certified Nurse Midwife

## 2014-11-23 VITALS — BP 110/78 | HR 70 | Resp 16 | Ht 67.25 in | Wt 190.0 lb

## 2014-11-23 DIAGNOSIS — Z124 Encounter for screening for malignant neoplasm of cervix: Secondary | ICD-10-CM

## 2014-11-23 DIAGNOSIS — Z30432 Encounter for removal of intrauterine contraceptive device: Secondary | ICD-10-CM

## 2014-11-23 DIAGNOSIS — Z01419 Encounter for gynecological examination (general) (routine) without abnormal findings: Secondary | ICD-10-CM | POA: Diagnosis not present
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
## 2014-11-23 NOTE — Patient Instructions (Signed)
EXERCISE AND DIET:  We recommended that you start or continue a regular exercise program for good health. Regular exercise means any activity that makes your heart beat faster and makes you sweat.  We recommend exercising at least 30 minutes per day at least 3 days a week, preferably 4 or 5.  We also recommend a diet low in fat and sugar.  Inactivity, poor dietary choices and obesity can cause diabetes, heart attack, stroke, and kidney damage, among others.    ALCOHOL AND SMOKING:  Women should limit their alcohol intake to no more than 7 drinks/beers/glasses of wine (combined, not each!) per week. Moderation of alcohol intake to this level decreases your risk of breast cancer and liver damage. And of course, no recreational drugs are part of a healthy lifestyle.  And absolutely no smoking or even second hand smoke. Most people know smoking can cause heart and lung diseases, but did you know it also contributes to weakening of your bones? Aging of your skin?  Yellowing of your teeth and nails?  CALCIUM AND VITAMIN D:  Adequate intake of calcium and Vitamin D are recommended.  The recommendations for exact amounts of these supplements seem to change often, but generally speaking 600 mg of calcium (either carbonate or citrate) and 800 units of Vitamin D per day seems prudent. Certain women may benefit from higher intake of Vitamin D.  If you are among these women, your doctor will have told you during your visit.    PAP SMEARS:  Pap smears, to check for cervical cancer or precancers,  have traditionally been done yearly, although recent scientific advances have shown that most women can have pap smears less often.  However, every woman still should have a physical exam from her gynecologist every year. It will include a breast check, inspection of the vulva and vagina to check for abnormal growths or skin changes, a visual exam of the cervix, and then an exam to evaluate the size and shape of the uterus and  ovaries.  And after 33 years of age, a rectal exam is indicated to check for rectal cancers. We will also provide age appropriate advice regarding health maintenance, like when you should have certain vaccines, screening for sexually transmitted diseases, bone density testing, colonoscopy, mammograms, etc.   MAMMOGRAMS:  All women over 40 years old should have a yearly mammogram. Many facilities now offer a "3D" mammogram, which may cost around $50 extra out of pocket. If possible,  we recommend you accept the option to have the 3D mammogram performed.  It both reduces the number of women who will be called back for extra views which then turn out to be normal, and it is better than the routine mammogram at detecting truly abnormal areas.    COLONOSCOPY:  Colonoscopy to screen for colon cancer is recommended for all women at age 50.  We know, you hate the idea of the prep.  We agree, BUT, having colon cancer and not knowing it is worse!!  Colon cancer so often starts as a polyp that can be seen and removed at colonscopy, which can quite literally save your life!  And if your first colonoscopy is normal and you have no family history of colon cancer, most women don't have to have it again for 10 years.  Once every ten years, you can do something that may end up saving your life, right?  We will be happy to help you get it scheduled when you are ready.    Be sure to check your insurance coverage so you understand how much it will cost.  It may be covered as a preventative service at no cost, but you should check your particular policy.     Diaphragm Information and Use A diaphragmis a soft, latex or silicone dome-shaped barrier that is placed in the vagina with spermicidal jelly before sexual intercourse. It covers the cervix, kills sperm, and blocks the passage of sperm into the cervix. This method does not protect against sexually transmitted diseases (STDs). A diaphragm must be fitted by a health care  provider during a pelvic exam. A health care provider will measure your vagina prior to prescribing a diaphragm. You will also learn about use, care, and problems of a diaphragm during the exam. If you have significant weight changes (gain or lose 20% of your body weight) or become pregnant, it is important to have the diaphragm rechecked and possibly refitted. The diaphragm should be replaced every 2 years, or sooner if damaged.  The diaphragm can be inserted up to 2 hours before sex. If it is inserted more than 2 hours before intercourse, then the spermicide must be reapplied.  ADVANTAGES  You can use it while breastfeeding.  It is not felt by your sex partner.  It does not interfere with your female hormones.  It works immediately and is not permanent. DISADVANTAGES  It is sometimes difficult to insert.  It may shift out of place during sexual intercourse.  You must have it fitted and refitted by your health care provider.  It may increase the risk of urinary tract infections.  There is an increased risk of toxic shock syndrome if it is left in place for more than 24 hours. HOW TO INSERT A DIAPHRAGM 1. Check the diaphragm for holes by holding it up to the light, stretching the latex, or by filling it with water. 2. Place the spermicide cream or jelly inside the dome and around the rim of the diaphragm. 3. Squeeze the rim of the diaphragm and insert the diaphragm into the vagina. 4. The opening of the dome should face the cervix while inserting the diaphragm. 5. The front part (or top) of the rim should be behind the pubic bone and pushed over the top of the cervix. 6. Be sure the cervix is completely covered by reaching into your vagina and feeling the cervix behind the latex dome of the diaphragm. If you are uncomfortable, it is not inserted properly. Try inserting it again. 7. Leave the diaphragm in for 6 to 8 hours after intercourse. If intercourse occurs again within these 6  hours, another application of spermicide is needed. 8. The diaphragm should not be left in place for longer than 24 hours. HOME CARE INSTRUCTIONS   Wash the diaphragm with mild soap and warm water. Rinse it thoroughly and dry it completely after every use.  Only use water-based lubricants with the diaphragm. Oil-based lubricants can damage the diaphragm.  Do not use talc on the diaphragm.  Do not use the diaphragm if:  You had a baby in the last 2 months.  You have a vaginal infection.  You are having a menstrual period.  You had recent surgery on your cervix or vagina.  You have vaginal bleeding of unknown cause.  Your sex partner is allergic to latex or spermicides. SEEK MEDICAL CARE IF:   You have pain during sexual intercourse when using the diaphragm.  The diaphragm slips out of place during sexual intercourse.  You have blood in your urine.  You have burning or pain when you urinate.  You find a hole in the diaphragm.  You develop abnormal vaginal discharge.  You have vaginal itching or irritation.  You cannot remove the diaphragm.  You think you may be pregnant.  You need to be refitted for a diaphragm. Document Released: 06/08/2002 Document Revised: 03/23/2013 Document Reviewed: 09/07/2012 [facility] Patient Information 2015 [place], [place]. This information is not intended to replace advice given to you by your health care provider. Make sure you discuss any questions you have with your health care provider.

## 2014-11-25 NOTE — Progress Notes (Signed)
Reviewed personally.  M. Suzanne Aalani Aikens, MD.  

## 2014-11-28 ENCOUNTER — Telehealth: Payer: Self-pay | Admitting: Certified Nurse Midwife

## 2014-11-28 LAB — IPS PAP TEST WITH HPV

## 2014-11-28 NOTE — Telephone Encounter (Signed)
Patient calling for recent results and to check on status of scheduling her IUD placement.

## 2014-11-28 NOTE — Telephone Encounter (Signed)
Spoke with patient. Advised patient pap smear from 11/23/2014 is still pending. Advised she will be notified once we have received final results. Patient would like to schedule IUD removal at this time. Appointment scheduled for 9/2 at 2 pm with Verner Chol CNM. Patient is agreeable and verbalizes understanding.  Routing to provider for final review. Patient agreeable to disposition. Will close encounter.

## 2014-11-29 ENCOUNTER — Telehealth: Payer: Self-pay | Admitting: Certified Nurse Midwife

## 2014-11-29 NOTE — Telephone Encounter (Signed)
Call to patient to advise of insurance benefits for IUD removal.Patient is agreeable and already scheduled for 12/02/14 with Leota Sauers.   Routing to provider for final review. Patient agreeable to disposition. Will close encounter.

## 2014-12-02 ENCOUNTER — Ambulatory Visit (INDEPENDENT_AMBULATORY_CARE_PROVIDER_SITE_OTHER): Payer: PRIVATE HEALTH INSURANCE | Admitting: Certified Nurse Midwife

## 2014-12-02 ENCOUNTER — Encounter: Payer: Self-pay | Admitting: Certified Nurse Midwife

## 2014-12-02 DIAGNOSIS — Z30432 Encounter for removal of intrauterine contraceptive device: Secondary | ICD-10-CM | POA: Diagnosis not present

## 2014-12-02 NOTE — Progress Notes (Signed)
Reviewed personally.  M. Suzanne Kahlel Peake, MD.  

## 2014-12-02 NOTE — Progress Notes (Signed)
33 yrsCaucasian Married G1P0010 LMP 11/09/14.  Presents for ParaGard removal.  Denies any vaginal symptoms or STD concerns.  Plans for contraception are condoms. Denies any pelvic pain or unusual discharge.  HPI neg. And pertinent to visit.  Abdomen: soft non-tender Groin:no inguinal nodes palpated    Pelvic exam:Pelvic exam: VULVA: normal appearing vulva with no masses, tenderness or lesions, VAGINA: normal appearing vagina with normal color and discharge, no lesions, CERVIX: normal appearing cervix without discharge or lesions, UTERUS: uterus is normal size, shape, consistency and nontender, ADNEXA: normal adnexa in size, nontender and no masses.  Procedure: Speculum placed, cervix visualized.  IUD string visualized, grasp with ring forceps, with gentle traction IUD removed intact.  IUD shown to patient and discarded. Speculum removed. Minimal bleeding noted on removal.  Assessment:ParaGard removal intact Pt tolerated procedure well.  Plan: Begin contraceptive choice of condoms, discussed back up plan if condom breakage.   Questions addressed  Return Visit prn/aex

## 2015-01-07 ENCOUNTER — Telehealth: Payer: Self-pay | Admitting: Internal Medicine

## 2015-01-07 DIAGNOSIS — M5126 Other intervertebral disc displacement, lumbar region: Secondary | ICD-10-CM

## 2015-01-07 NOTE — Telephone Encounter (Signed)
Pt states she had to stop physical therapy; was improving until Abdominal exercise caused numbness in legs. Now is seeking referral to Georgette Dover, [facility]. PT also request a Rx refill on Percocet.   [phone]

## 2015-01-08 NOTE — Telephone Encounter (Signed)
Ref sent [facility] for refill percocet

## 2015-01-09 MED ORDER — OXYCODONE-ACETAMINOPHEN 10-325 MG PO TABS: 1.0000 | ORAL_TABLET | Freq: Three times a day (TID) | ORAL | Status: DC | PRN

## 2015-01-09 NOTE — Telephone Encounter (Signed)
rx printed at 104. Will bring to 102 after clinic.  Meds ordered this encounter  Medications  . oxyCODONE-acetaminophen (PERCOCET) 10-325 MG tablet    Sig: Take 1 tablet by mouth every 8 (eight) hours as needed for pain.    Dispense:  20 tablet    Refill:  0    Order Specific Question:  Supervising Provider    Answer:  DOOLITTLE, ROBERT P [[id number]]

## 2015-01-09 NOTE — Telephone Encounter (Signed)
Notified pt Rx ready and ref has been made to surgeon.

## 2015-01-25 NOTE — Telephone Encounter (Signed)
Error

## 2015-01-28 ENCOUNTER — Other Ambulatory Visit: Payer: Self-pay | Admitting: Internal Medicine

## 2015-01-28 DIAGNOSIS — F900 Attention-deficit hyperactivity disorder, predominantly inattentive type: Secondary | ICD-10-CM

## 2015-01-28 NOTE — Telephone Encounter (Signed)
Patient request a refill Adderall 20 MG.

## 2015-01-31 MED ORDER — AMPHETAMINE-DEXTROAMPHETAMINE 20 MG PO TABS: 20.0000 mg | ORAL_TABLET | Freq: Three times a day (TID) | ORAL | Status: DC

## 2015-01-31 NOTE — Telephone Encounter (Signed)
Notified pt on VM ready. 

## 2015-01-31 NOTE — Telephone Encounter (Signed)
Did this get done

## 2015-01-31 NOTE — Telephone Encounter (Signed)
Meds ordered this encounter  Medications  . amphetamine-dextroamphetamine (ADDERALL) 20 MG tablet    Sig: Take 1 tablet (20 mg total) by mouth 3 (three) times daily.    Dispense:  90 tablet    Refill:  0    

## 2015-02-27 ENCOUNTER — Encounter: Payer: Self-pay | Admitting: Internal Medicine

## 2015-03-14 ENCOUNTER — Telehealth: Payer: Self-pay

## 2015-03-14 DIAGNOSIS — F900 Attention-deficit hyperactivity disorder, predominantly inattentive type: Secondary | ICD-10-CM

## 2015-03-14 DIAGNOSIS — M5416 Radiculopathy, lumbar region: Secondary | ICD-10-CM

## 2015-03-14 MED ORDER — AMPHETAMINE-DEXTROAMPHETAMINE 20 MG PO TABS: 20.0000 mg | ORAL_TABLET | Freq: Three times a day (TID) | ORAL | Status: DC

## 2015-03-14 NOTE — Telephone Encounter (Signed)
Pt in need of her ADDERALL 20 MG. Please call [phone]  Also [facility]

## 2015-03-14 NOTE — Telephone Encounter (Signed)
Meds ordered this encounter  Medications  . amphetamine-dextroamphetamine (ADDERALL) 20 MG tablet    Sig: Take 1 tablet (20 mg total) by mouth 3 (three) times daily.    Dispense:  90 tablet    Refill:  0  . amphetamine-dextroamphetamine (ADDERALL) 20 MG tablet    Sig: Take 1 tablet (20 mg total) by mouth 3 (three) times daily. For 30d after signed    Dispense:  90 tablet    Refill:  0

## 2015-03-15 NOTE — Telephone Encounter (Signed)
Notified pt Rxs ready.  Pt reported that [facility] had read her old MRI from over a year ago and decided that she should just go [facility] rather than to a neurosurgeon at this point. Pt stated that it looks like the spine center there does much the same treatments as [facility] and she would rather get in to see a neurosurgeon. She has another Careers [facility]urgeon [facility] that she would like to eventually be referred to, but wants Dr Doolittle's input as to whether she needs to go ahead and go to [facility] as Dr Wyline MoodBranch advised, or if it might be helpful to have Dr Merla Richesoolittle order [facility], and then resend the new one to the other neurosurgeon she would like to get [facility] to [facility]? Dr Merla Richesoolittle, please advise which plan you would recommend.

## 2015-03-20 NOTE — Telephone Encounter (Signed)
The ortho spine ctr would be fine Dr Wyline MoodBranch is the neurosurgery guru --did she actually see him or did he just read the MRI? [facility] would probably see her with the old mri results and then decide if they need a new one

## 2015-03-21 NOTE — Telephone Encounter (Signed)
Patient is calling because she hasn't heard anything back. I gave patient the previous message and she doesn't fully understand. Please call patient!

## 2015-03-21 NOTE — Telephone Encounter (Signed)
Dr Merla Richesoolittle, the pt was not able to see Dr Wyline MoodBranch with the old MRI, which is why she was asking if you thought it would be possible for you to order another MRI and he might see her with new results. She was trying to avoid the step of going through [facility] if possible since she has already done that here in [place]. But if you think that step is needed, she will go there first.

## 2015-03-22 NOTE — Telephone Encounter (Signed)
Called pt and advised on VM about new order for MRI.

## 2015-04-01 ENCOUNTER — Ambulatory Visit
Admission: RE | Admit: 2015-04-01 | Discharge: 2015-04-01 | Disposition: A | Discharge status: Home / Self Care | Payer: Self-pay | Source: Ambulatory Visit | Attending: Internal Medicine | Admitting: Internal Medicine

## 2015-04-01 DIAGNOSIS — M5416 Radiculopathy, lumbar region: Secondary | ICD-10-CM

## 2015-04-05 ENCOUNTER — Encounter: Payer: Self-pay | Admitting: Internal Medicine

## 2015-04-05 DIAGNOSIS — M5416 Radiculopathy, lumbar region: Secondary | ICD-10-CM

## 2015-04-17 ENCOUNTER — Telehealth: Payer: Self-pay

## 2015-04-17 NOTE — Telephone Encounter (Signed)
Percocet refill please    Best phone is [phone]

## 2015-04-18 ENCOUNTER — Encounter: Payer: Self-pay | Admitting: Certified Nurse Midwife

## 2015-04-18 MED ORDER — OXYCODONE-ACETAMINOPHEN 10-325 MG PO TABS: 1.0000 | ORAL_TABLET | Freq: Three times a day (TID) | ORAL | Status: DC | PRN

## 2015-04-18 NOTE — Telephone Encounter (Signed)
Rx printed at 104. Will bring to 102 after clinic.  Meds ordered this encounter  Medications  . oxyCODONE-acetaminophen (PERCOCET) 10-325 MG tablet    Sig: Take 1 tablet by mouth every 8 (eight) hours as needed for pain.    Dispense:  20 tablet    Refill:  0    Order Specific Question:  Supervising Provider    Answer:  DOOLITTLE, ROBERT P [[id number]]

## 2015-04-19 NOTE — Telephone Encounter (Signed)
Rx in pick up draw. Called pt and advised RX ready for pick up on VM

## 2015-06-04 ENCOUNTER — Ambulatory Visit (INDEPENDENT_AMBULATORY_CARE_PROVIDER_SITE_OTHER): Payer: PRIVATE HEALTH INSURANCE | Admitting: Internal Medicine

## 2015-06-04 VITALS — BP 124/80 | HR 94 | Temp 98.4°F | Resp 18 | Wt 189.4 lb

## 2015-06-04 DIAGNOSIS — M5416 Radiculopathy, lumbar region: Secondary | ICD-10-CM | POA: Diagnosis not present

## 2015-06-04 DIAGNOSIS — F900 Attention-deficit hyperactivity disorder, predominantly inattentive type: Secondary | ICD-10-CM | POA: Diagnosis not present

## 2015-06-04 MED ORDER — CYCLOBENZAPRINE HCL 10 MG PO TABS: 10.0000 mg | ORAL_TABLET | Freq: Three times a day (TID) | ORAL | Status: DC | PRN

## 2015-06-04 MED ORDER — OXYCODONE-ACETAMINOPHEN 10-325 MG PO TABS: 1.0000 | ORAL_TABLET | Freq: Three times a day (TID) | ORAL | Status: DC | PRN

## 2015-06-04 MED ORDER — AMPHETAMINE-DEXTROAMPHETAMINE 20 MG PO TABS: 20.0000 mg | ORAL_TABLET | Freq: Three times a day (TID) | ORAL | Status: DC

## 2015-06-04 MED ORDER — MELOXICAM 15 MG PO TABS: 15.0000 mg | ORAL_TABLET | Freq: Every day | ORAL | Status: DC
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
## 2015-07-03 ENCOUNTER — Ambulatory Visit (INDEPENDENT_AMBULATORY_CARE_PROVIDER_SITE_OTHER): Payer: PRIVATE HEALTH INSURANCE | Admitting: Physician Assistant

## 2015-07-03 VITALS — BP 94/72 | HR 60 | Temp 97.9°F | Resp 16 | Ht 67.5 in | Wt 189.0 lb

## 2015-07-03 DIAGNOSIS — R05 Cough: Secondary | ICD-10-CM

## 2015-07-03 DIAGNOSIS — R059 Cough, unspecified: Secondary | ICD-10-CM

## 2015-07-03 DIAGNOSIS — J069 Acute upper respiratory infection, unspecified: Secondary | ICD-10-CM | POA: Diagnosis not present

## 2015-07-03 DIAGNOSIS — B9789 Other viral agents as the cause of diseases classified elsewhere: Secondary | ICD-10-CM

## 2015-07-03 LAB — POCT CBC
Granulocyte percent: 70.8 %G (ref 37–80)
HCT, POC: 43.2 % (ref 37.7–47.9)
Hemoglobin: 14.7 g/dL (ref 12.2–16.2)
Lymph, poc: 1.8 (ref 0.6–3.4)
MCH: 29.5 pg (ref 27–31.2)
MCHC: 34.1 g/dL (ref 31.8–35.4)
MCV: 86.3 fL (ref 80–97)
MID (CBC): 0.6 (ref 0–0.9)
MPV: 6.7 fL (ref 0–99.8)
PLATELET COUNT, POC: 295 10*3/uL (ref 142–424)
POC Granulocyte: 5.8 (ref 2–6.9)
POC LYMPH %: 21.9 % (ref 10–50)
POC MID %: 7.3 % (ref 0–12)
RBC: 5 M/uL (ref 4.04–5.48)
RDW, POC: 13.6 %
WBC: 8.2 10*3/uL (ref 4.6–10.2)

## 2015-07-03 NOTE — Progress Notes (Signed)
07/03/2015 4:51 PM   DOB: [birth year] / MRN: [id number]  SUBJECTIVE:  Regina Tyler is a 35 y.o. female presenting for a cough that has been present for 7 days now.  Reports clear sputum.  This was preceded by a sore throat and nasal congestion, both of which are improving.  She is have back surgery tomorrow and wants to rule out an infection that could compromise her surgery.  She has had no medication today.   She has No Known Allergies.   She  has a past medical history of Anemia; Anxiety; Migraines; Depression; and Herniated disc, cervical.    She  reports that she has never smoked. She has never used smokeless tobacco. She reports that she drinks about 1.8 oz of alcohol per week. She reports that she does not use illicit drugs. She  reports that she currently engages in sexual activity and has had female partners. She reports using the following method of birth control/protection: IUD. The patient  has past surgical history that includes Appendectomy; Ankle surgery; Spine surgery; and paragard insertion.  Her family history includes Cancer in her maternal grandfather and maternal grandmother; Cancer (age of onset: 5260) in her mother; Hypertension in her paternal grandmother; Parkinson's disease in her paternal grandfather.  Review of Systems  Constitutional: Positive for malaise/fatigue. Negative for fever, chills and diaphoresis.  HENT: Positive for congestion and sore throat.   Respiratory: Positive for cough. Negative for hemoptysis, shortness of breath and wheezing.   Cardiovascular: Negative for chest pain.  Gastrointestinal: Negative for nausea.  Skin: Negative for rash.  Neurological: Negative for dizziness and weakness.  Endo/Heme/Allergies: Negative for polydipsia.    Problem list and medications reviewed and updated by myself where necessary, and exist elsewhere in the encounter.   OBJECTIVE:  BP 94/72 mmHg  Pulse 60  Temp(Src) 97.9 F (36.6 C) (Oral)  Resp 16  Ht 5' 7.5"  (1.715 m)  Wt 189 lb (85.73 kg)  BMI 29.15 kg/m2  SpO2 98%  LMP 06/27/2015  Physical Exam  Constitutional: She is oriented to person, place, and time. She appears well-nourished. No distress.  HENT:  Right Ear: External ear normal.  Left Ear: External ear normal.  Nose: Mucosal edema present. Right sinus exhibits no maxillary sinus tenderness and no frontal sinus tenderness. Left sinus exhibits no maxillary sinus tenderness and no frontal sinus tenderness.  Mouth/Throat: Oropharynx is clear and moist. No oropharyngeal exudate.  Eyes: Conjunctivae and EOM are normal. Pupils are equal, round, and reactive to light.  Cardiovascular: Normal rate, regular rhythm and normal heart sounds.   Pulmonary/Chest: Effort normal and breath sounds normal. She has no rales.  Abdominal: She exhibits no distension.  Neurological: She is alert and oriented to person, place, and time. No cranial nerve deficit. Gait normal.  Skin: Skin is warm and dry. No rash noted. She is not diaphoretic. No erythema.  Psychiatric: She has a normal mood and affect. Her behavior is normal.  Vitals reviewed.   Results for orders placed or performed in visit on 07/03/15 (from the past 72 hour(s))  POCT CBC     Status: None   Collection Time: 07/03/15  1:46 PM  Result Value Ref Range   WBC 8.2 4.6 - 10.2 K/uL   Lymph, poc 1.8 0.6 - 3.4   POC LYMPH PERCENT 21.9 10 - 50 %L   MID (cbc) 0.6 0 - 0.9   POC MID % 7.3 0 - 12 %M   POC Granulocyte 5.8 2 -  6.9   Granulocyte percent 70.8 37 - 80 %G   RBC 5.00 4.04 - 5.48 M/uL   Hemoglobin 14.7 12.2 - 16.2 g/dL   HCT, POC 40.9 81.1 - 47.9 %   MCV 86.3 80 - 97 fL   MCH, POC 29.5 27 - 31.2 pg   MCHC 34.1 31.8 - 35.4 g/dL   RDW, POC 91.4 %   Platelet Count, POC 295 142 - 424 K/uL   MPV 6.7 0 - 99.8 fL    No results found.  ASSESSMENT AND PLAN  Regina Tyler was seen today for cough.  Diagnoses and all orders for this visit:  Cough -     POCT CBC  Viral URI with cough:  Advised that she pursue surgery tomorrow.      The patient was advised to call or return to clinic if she does not see an improvement in symptoms or to seek the care of the closest emergency department if she worsens with the above plan.   Deliah Boston, MHS, PA-C [facility] 07/03/2015 4:51 PM

## 2015-07-03 NOTE — Patient Instructions (Signed)
     IF you received an x-ray today, you will receive an invoice from [facility]. Please contact [facility] at [phone] with questions or concerns regarding your invoice.   IF you received labwork today, you will receive an invoice from [facility]. Please contact [facility] at [phone] with questions or concerns regarding your invoice.   Our billing staff will not be able to assist you with questions regarding bills from these companies.  You will be contacted with the lab results as soon as they are available. The fastest way to get your results is to activate your My Chart account. Instructions are located on the last page of this paperwork. If you have not heard from us regarding the results in 2 weeks, please contact this office.      

## 2015-07-04 HISTORY — PX: LUMBAR LAMINECTOMY: SHX95

## 2015-07-18 DIAGNOSIS — Z9889 Other specified postprocedural states: Secondary | ICD-10-CM | POA: Insufficient documentation

## 2015-09-16 ENCOUNTER — Ambulatory Visit (INDEPENDENT_AMBULATORY_CARE_PROVIDER_SITE_OTHER): Payer: PRIVATE HEALTH INSURANCE | Admitting: Internal Medicine

## 2015-09-16 VITALS — BP 104/80 | HR 91 | Temp 97.7°F | Resp 18 | Wt 183.0 lb

## 2015-09-16 DIAGNOSIS — Z9889 Other specified postprocedural states: Secondary | ICD-10-CM

## 2015-09-16 DIAGNOSIS — F900 Attention-deficit hyperactivity disorder, predominantly inattentive type: Secondary | ICD-10-CM | POA: Diagnosis not present

## 2015-09-16 MED ORDER — AMPHETAMINE-DEXTROAMPHETAMINE 20 MG PO TABS: 20.0000 mg | ORAL_TABLET | Freq: Three times a day (TID) | ORAL | Status: DC

## 2015-09-16 MED ORDER — CYCLOBENZAPRINE HCL 10 MG PO TABS: 10.0000 mg | ORAL_TABLET | Freq: Three times a day (TID) | ORAL | Status: DC | PRN

## 2015-09-16 MED ORDER — MELOXICAM 15 MG PO TABS: 15.0000 mg | ORAL_TABLET | Freq: Every day | ORAL | Status: DC

## 2015-09-16 NOTE — Patient Instructions (Addendum)
followup for ADD treatment with Regina LennertSarah Weber PA-C

## 2015-09-16 NOTE — Progress Notes (Signed)
Subjective:  By signing my name below, I, Regina Tyler, attest that this documentation has been prepared under the direction and in the presence of Ellamae Sia, MD.  Electronically Signed: Andrew Au, ED Scribe. 09/16/2015. 1:38 PM.   Patient ID: Regina Tyler, female    DOB: November 15, 1980, 35 y.o.   MRN: [id number]  HPI Chief Complaint  Patient presents with  . Medication Refill    adderall, meloxicam, flexeril   HPI Comments: Regina Tyler is a 35 y.o. female who presents to the [facility] medication refill. She had lumbar laminectomy at right L4 07/04/15 at [facility]. Pt reports sciatic has resolved since having surgery. She has been taking tumeric and omega 3. She plans to meet with a yoga instructor. Pt would like to start trying for a baby and is curious of to how long she should wait post surgery. Otherwise she is doing well.    Regina Tyler /songwriter Patient Active Problem List   Diagnosis Date Noted  . Lumbar herniated disc 12/30/2013  . Neck pain 10/14/2012  . Musculoskeletal strain 10/14/2012  . ADHD (attention deficit hyperactivity disorder) 10/11/2011    Past Surgical History  Procedure Laterality Date  . Appendectomy    . Ankle surgery    . Spine surgery      cervical injections  . Paragard insertion     No Known Allergies Prior to Admission medications   Medication Sig Start Date End Date Taking? Authorizing Provider  amphetamine-dextroamphetamine (ADDERALL) 20 MG tablet Take 1 tablet (20 mg total) by mouth 3 (three) times daily. For 30d after signed 06/04/15  Yes Tonye Pearson, MD  cyclobenzaprine (FLEXERIL) 10 MG tablet Take 1 tablet (10 mg total) by mouth 3 (three) times daily as needed. for muscle spams 06/04/15  Yes Tonye Pearson, MD  meloxicam (MOBIC) 15 MG tablet Take 1 tablet (15 mg total) by mouth daily. 06/04/15  Yes Tonye Pearson, MD  amphetamine-dextroamphetamine (ADDERALL) 20 MG tablet Take 1 tablet (20 mg total) by mouth 3  (three) times daily. Patient not taking: Reported on 09/16/2015 06/04/15   Tonye Pearson, MD  amphetamine-dextroamphetamine (ADDERALL) 20 MG tablet Take 1 tablet (20 mg total) by mouth 3 (three) times daily. For 60d after signed Patient not taking: Reported on 09/16/2015 06/04/15   Tonye Pearson, MD  oxyCODONE-acetaminophen (PERCOCET) 10-325 MG tablet Take 1 tablet by mouth every 8 (eight) hours as needed for pain. Patient not taking: Reported on 09/16/2015 06/04/15   Tonye Pearson, MD    Review of Systems     Objective:   Physical Exam  Constitutional: She is oriented to person, place, and time. She appears well-developed and well-nourished. No distress.  HENT:  Head: Normocephalic and atraumatic.  Eyes: Conjunctivae and EOM are normal.  Neck: Neck supple.  Cardiovascular: Normal rate.   Pulmonary/Chest: Effort normal.  Musculoskeletal: Normal range of motion.  Neurological: She is alert and oriented to person, place, and time.  Skin: Skin is warm and dry.  Psychiatric: She has a normal mood and affect. Her behavior is normal.  Nursing note and vitals reviewed.   Filed Vitals:   09/16/15 1323  BP: 104/80  Pulse: 91  Temp: 97.7 F (36.5 C)  TempSrc: Oral  Resp: 18  Weight: 183 lb (83.008 kg)  SpO2: 98%    Assessment & Plan:  Attention deficit hyperactivity disorder (ADHD), predominantly inattentive type - Plan: amphetamine-dextroamphetamine (ADDERALL) 20 MG tablet  History of excision of lamina  of lumbar vertebra for decompression of spinal cord  Meds ordered this encounter  Medications  . amphetamine-dextroamphetamine (ADDERALL) 20 MG tablet    Sig: Take 1 tablet (20 mg total) by mouth 3 (three) times daily. For 30d after signed    Dispense:  90 tablet    Refill:  0  . amphetamine-dextroamphetamine (ADDERALL) 20 MG tablet    Sig: Take 1 tablet (20 mg total) by mouth 3 (three) times daily.    Dispense:  90 tablet    Refill:  0  .  amphetamine-dextroamphetamine (ADDERALL) 20 MG tablet    Sig: Take 1 tablet (20 mg total) by mouth 3 (three) times daily. For 60d after signed    Dispense:  90 tablet    Refill:  0  . cyclobenzaprine (FLEXERIL) 10 MG tablet    Sig: Take 1 tablet (10 mg total) by mouth 3 (three) times daily as needed. for muscle spams    Dispense:  30 tablet    Refill:  5  . meloxicam (MOBIC) 15 MG tablet    Sig: Take 1 tablet (15 mg total) by mouth daily.    Dispense:  30 tablet    Refill:  5  F/U S Weber PA-C 3-656mos  Start yoga  I have completed the patient encounter in its entirety as documented by the scribe, with editing by me where necessary. Venkat Ankney P. Merla Richesoolittle, M.D.

## 2015-10-13 ENCOUNTER — Telehealth: Payer: Self-pay

## 2015-10-13 NOTE — Telephone Encounter (Signed)
I called pt to let her know her medical records were ready for pick up.

## 2015-10-27 ENCOUNTER — Encounter: Payer: Self-pay | Admitting: Certified Nurse Midwife

## 2015-10-27 ENCOUNTER — Telehealth: Payer: Self-pay | Admitting: Certified Nurse Midwife

## 2015-10-27 NOTE — Telephone Encounter (Signed)
Unable to leave message regarding upcoming aex appointment 11/28/15 has been canceled and needs to be rescheduled with Leota Sauers, CNM. Letter mailed for patient to call and reschedule.

## 2015-11-18 ENCOUNTER — Encounter: Payer: Self-pay | Admitting: Physician Assistant

## 2015-11-28 ENCOUNTER — Ambulatory Visit: Payer: PRIVATE HEALTH INSURANCE | Admitting: Certified Nurse Midwife

## 2015-11-29 ENCOUNTER — Ambulatory Visit (INDEPENDENT_AMBULATORY_CARE_PROVIDER_SITE_OTHER): Payer: PRIVATE HEALTH INSURANCE | Admitting: Nurse Practitioner

## 2015-11-29 ENCOUNTER — Encounter: Payer: Self-pay | Admitting: Nurse Practitioner

## 2015-11-29 VITALS — BP 108/66 | HR 64 | Ht 67.5 in | Wt 183.0 lb

## 2015-11-29 DIAGNOSIS — Z0000 Encounter for general adult medical examination without abnormal findings: Secondary | ICD-10-CM

## 2015-11-29 DIAGNOSIS — Z01419 Encounter for gynecological examination (general) (routine) without abnormal findings: Secondary | ICD-10-CM | POA: Diagnosis not present

## 2015-11-29 LAB — POCT URINALYSIS DIPSTICK
BILIRUBIN UA: NEGATIVE
GLUCOSE UA: NEGATIVE
Ketones, UA: NEGATIVE
Leukocytes, UA: NEGATIVE
Nitrite, UA: NEGATIVE
Protein, UA: NEGATIVE
RBC UA: NEGATIVE
Urobilinogen, UA: NEGATIVE
pH, UA: 7

## 2015-11-29 NOTE — Patient Instructions (Signed)

## 2015-11-29 NOTE — Progress Notes (Signed)
Reviewed personally.  M. Suzanne Teva Bronkema, MD.  
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
## 2015-12-05 LAB — IPS PAP TEST WITH HPV

## 2015-12-06 LAB — HEMOGLOBIN, FINGERSTICK: HEMOGLOBIN, FINGERSTICK: 15.2 g/dL (ref 12.0–16.0)

## 2015-12-08 ENCOUNTER — Telehealth: Payer: Self-pay

## 2015-12-08 NOTE — Telephone Encounter (Addendum)
Pt is leaving to go out of town and is hoping to get the adderall rx refilled today   Best number [phone]

## 2015-12-12 MED ORDER — AMPHETAMINE-DEXTROAMPHETAMINE 20 MG PO TABS: 20.0000 mg | ORAL_TABLET | Freq: Three times a day (TID) | ORAL | 0 refills | Status: DC

## 2015-12-12 NOTE — Telephone Encounter (Signed)
Done

## 2015-12-12 NOTE — Telephone Encounter (Signed)
Patient stated she is leaving around 12:30 today to go out of town, Patient request for adderall. [phone]

## 2015-12-12 NOTE — Telephone Encounter (Signed)
Called pt and caught her before they left town. She will come p/up Rx.

## 2015-12-12 NOTE — Telephone Encounter (Signed)
Please f/u

## 2015-12-12 NOTE — Telephone Encounter (Signed)
Can we write Rx, last seen in June by Dr. Merla Richesoolittle for ADD.

## 2016-01-15 ENCOUNTER — Telehealth: Payer: Self-pay

## 2016-01-15 NOTE — Telephone Encounter (Signed)
Pt is needing a refill on adderall   best [phone]

## 2016-01-15 NOTE — Telephone Encounter (Signed)
Last OV w/Doolittle 6/17, last RF 9/12.

## 2016-01-20 MED ORDER — AMPHETAMINE-DEXTROAMPHETAMINE 20 MG PO TABS: 20.0000 mg | ORAL_TABLET | Freq: Three times a day (TID) | ORAL | 0 refills | Status: DC

## 2016-01-20 NOTE — Telephone Encounter (Signed)
Refill give but she will need an OV to establish with me before I give her more refills.

## 2016-01-20 NOTE — Telephone Encounter (Signed)
Spoke with pt - rx at front desk for pick up.  Note on rx -per Maralyn SagoSarah.  Pt needs to establish care with Sarah to get further refills.

## 2016-02-19 ENCOUNTER — Encounter: Payer: Self-pay | Admitting: Family Medicine

## 2016-02-19 ENCOUNTER — Ambulatory Visit (INDEPENDENT_AMBULATORY_CARE_PROVIDER_SITE_OTHER): Payer: Self-pay | Admitting: Family Medicine

## 2016-02-19 VITALS — BP 118/78 | HR 85 | Temp 98.2°F | Resp 17 | Ht 67.5 in | Wt 184.0 lb

## 2016-02-19 DIAGNOSIS — M545 Low back pain: Secondary | ICD-10-CM

## 2016-02-19 DIAGNOSIS — G8929 Other chronic pain: Secondary | ICD-10-CM

## 2016-02-19 DIAGNOSIS — F900 Attention-deficit hyperactivity disorder, predominantly inattentive type: Secondary | ICD-10-CM

## 2016-02-19 MED ORDER — AMPHETAMINE-DEXTROAMPHETAMINE 20 MG PO TABS: 20.0000 mg | ORAL_TABLET | Freq: Three times a day (TID) | ORAL | 0 refills | Status: DC

## 2016-02-19 MED ORDER — CYCLOBENZAPRINE HCL 10 MG PO TABS: 10.0000 mg | ORAL_TABLET | Freq: Three times a day (TID) | ORAL | 5 refills | Status: DC | PRN

## 2016-02-19 MED ORDER — MELOXICAM 15 MG PO TABS: 15.0000 mg | ORAL_TABLET | Freq: Every day | ORAL | 5 refills | Status: DC

## 2016-02-19 NOTE — Patient Instructions (Signed)
[facility] Policy for Prescribing Controlled Substances (Revised 01/2012) 1. Prescriptions for controlled substances will be filled by ONE provider at [facility] with whom you have established and developed a plan for your care, including follow-up. 2. You are encouraged to schedule an appointment with your prescriber at our appointment center for follow-up visits whenever possible. 3. If you request a prescription for the controlled substance while at [facility] for an acute problem (with someone other than your regular prescriber), you MAY be given a ONE-TIME prescription for a 30-day supply of the controlled substance, to allow time for you to return to see your regular prescriber for additional prescriptions.       4.  Call at MINIMUM 72 hours in advance for refills on your medication.  You may be required to schedule an appointment for a refill if you have already received your allotted amount or there are other concerns.       5.  You will need to be seen every 3 months for refills at minimum. If you ever feel like you need a change in your dose or the administration, you will need to be seen in an office visit.       6.  You need to use the same pharmacy every month and you cannot get any controlled substances from any other clinic.
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
## 2016-03-11 ENCOUNTER — Ambulatory Visit (INDEPENDENT_AMBULATORY_CARE_PROVIDER_SITE_OTHER): Payer: Self-pay | Admitting: Physician Assistant

## 2016-03-11 VITALS — BP 116/82 | HR 91 | Temp 98.1°F | Resp 17 | Ht 67.5 in | Wt 182.0 lb

## 2016-03-11 DIAGNOSIS — H5711 Ocular pain, right eye: Secondary | ICD-10-CM

## 2016-03-11 MED ORDER — POLYMYXIN B-TRIMETHOPRIM 10000-0.1 UNIT/ML-% OP SOLN: 1.0000 [drp] | OPHTHALMIC | 0 refills | Status: DC

## 2016-03-11 MED ORDER — CIPROFLOXACIN HCL 0.3 % OP SOLN: 1.0000 [drp] | OPHTHALMIC | 0 refills | Status: DC

## 2016-03-11 NOTE — Patient Instructions (Addendum)
  You are scheduled to see Dr. Dione BoozeGroat tomorrow @ 8am. Please arrive 15 minutes before appointment.    IF you received an x-ray today, you will receive an invoice from [facility]. Please contact [facility] at [phone] with questions or concerns regarding your invoice.   IF you received labwork today, you will receive an invoice from [facility]. Please contact [facility] at [phone] with questions or concerns regarding your invoice.   Our billing staff will not be able to assist you with questions regarding bills from these companies.  You will be contacted with the lab results as soon as they are available. The fastest way to get your results is to activate your My Chart account. Instructions are located on the last page of this paperwork. If you have not heard from us regarding the results in 2 weeks, please contact this office.

## 2016-03-11 NOTE — Progress Notes (Signed)
   03/11/2016 4:40 PM   DOB: [birth year] / MRN: [id number]  SUBJECTIVE:  Regina Tyler is a 35 y.o. female presenting for sensitivity to light and foreign body sensation that started last night. She associates clear drainage along with eye redness.  She is a contact lens wearer. It does not itch. She did have her contacts in this morning but has taken them out. No opportunity for a projectile to enter the eye.     She has No Known Allergies.   She  has a past medical history of Anemia; Anxiety; Depression; Herniated disc, cervical; and Migraines.    She  reports that she has never smoked. She has never used smokeless tobacco. She reports that she drinks about 1.8 oz of alcohol per week . She reports that she does not use drugs. She  reports that she currently engages in sexual activity and has had female partners. She reports using the following method of birth control/protection: Condom. The patient  has a past surgical history that includes Appendectomy (2003); Ankle surgery (Left, 2007); Spine surgery (2007/2008); paragard insertion (2011); and Lumbar laminectomy (07/04/2015).  Her family history includes Breast cancer (age of onset: 1360) in her mother; Cancer in her maternal grandfather and maternal grandmother; Hypertension in her paternal grandmother; Mitral valve prolapse in her father; Osteoporosis in her mother; Parkinson's disease in her paternal grandfather.  Review of Systems  HENT: Negative for ear pain and sinus pain.   Gastrointestinal: Positive for nausea.  Skin: Negative for rash.  Neurological: Negative for dizziness.    The problem list and medications were reviewed and updated by myself where necessary and exist elsewhere in the encounter.   OBJECTIVE:  BP 116/82 (BP Location: Right Arm, Patient Position: Sitting, Cuff Size: Large)   Pulse 91   Temp 98.1 F (36.7 C) (Oral)   Resp 17   Ht 5' 7.5" (1.715 m)   Wt 182 lb (82.6 kg)   LMP 03/01/2016 (Approximate)   SpO2 98%    BMI 28.08 kg/m   Physical Exam  Constitutional: She is oriented to person, place, and time. She appears well-developed and well-nourished.  Cardiovascular: Normal rate and regular rhythm.   Pulmonary/Chest: Effort normal and breath sounds normal.  Musculoskeletal: Normal range of motion.  Neurological: She is alert and oriented to person, place, and time.  Skin: Skin is warm and dry.    Visual Acuity Screening   Right eye Left eye Both eyes  Without correction: 20/50 20/30 20/40   With correction:        No results found for this or any previous visit (from the past 72 hour(s)).  No results found.  ASSESSMENT AND PLAN  Irving Burtonmily was seen today for eye pain.  Diagnoses and all orders for this visit:  Eye pain, right: She has a vision change, wears contacts, has photophobia and a foreign body sensation.  Her symptoms worry me and I would like her to see an eye doctor today if possible.     The patient is advised to call or return to clinic if she does not see an improvement in symptoms, or to seek the care of the closest emergency department if she worsens with the above plan.   Deliah BostonMichael Macy Lingenfelter, MHS, PA-C [facility] 03/11/2016 4:40 PM

## 2016-05-17 ENCOUNTER — Telehealth: Payer: Self-pay

## 2016-05-17 MED ORDER — AMPHETAMINE-DEXTROAMPHETAMINE 20 MG PO TABS: 20.0000 mg | ORAL_TABLET | Freq: Three times a day (TID) | ORAL | 0 refills | Status: DC

## 2016-05-17 NOTE — Telephone Encounter (Signed)
Pt is needing a refill on adderall   Best number [phone]

## 2016-05-17 NOTE — Telephone Encounter (Signed)
Patient notified and will pick up the script tomorrow and schedule appointment before next fill.

## 2016-05-17 NOTE — Telephone Encounter (Signed)
Printed. Pt can pick up Sat 2/17 or after. No further refills without office visit.

## 2016-05-17 NOTE — Telephone Encounter (Signed)
01/2016 last ov and refills

## 2016-07-25 ENCOUNTER — Telehealth: Payer: Self-pay | Admitting: Certified Nurse Midwife

## 2016-07-25 NOTE — Telephone Encounter (Signed)
Spoke with patient. Patient states that she is around [redacted] weeks pregnant and has been having light spotting and intermittent discomfort in her abdomen. Denies any heavy bleeding. Unable to assess if this is midline or on one side over the other. "It is hard to tell." Denies any pain at this time. Patient is very concerned and tearful. Advised she will need to be seen in office for further evaluation. Patient has concerns about lack of insurance. Questions answered after reviewing with Mervin Kung and Sue Lush. Appointment scheduled for 07/26/2016 at 8:30 am with Leota Sauers CNM. Patient is agreeable to date and. Aware if bleeding returns/increases or pain returns and is one sided and sharp will need to be seen immediately for evaluation at [facility] hospital or closest ER. Patient is agreeable.  Routing to provider for final review. Patient agreeable to disposition. Will close encounter.

## 2016-07-25 NOTE — Telephone Encounter (Signed)
Patient just learned she is pregnant and has a concern.  Patient is asking to talk with a nurse.

## 2016-07-26 ENCOUNTER — Ambulatory Visit (INDEPENDENT_AMBULATORY_CARE_PROVIDER_SITE_OTHER): Payer: Medicaid Other | Admitting: Certified Nurse Midwife

## 2016-07-26 ENCOUNTER — Encounter: Payer: Self-pay | Admitting: Certified Nurse Midwife

## 2016-07-26 ENCOUNTER — Telehealth: Payer: Self-pay

## 2016-07-26 VITALS — BP 110/78 | HR 100 | Resp 16 | Ht 67.25 in | Wt 189.0 lb

## 2016-07-26 DIAGNOSIS — R3915 Urgency of urination: Secondary | ICD-10-CM | POA: Diagnosis not present

## 2016-07-26 DIAGNOSIS — Z3201 Encounter for pregnancy test, result positive: Secondary | ICD-10-CM

## 2016-07-26 DIAGNOSIS — R58 Hemorrhage, not elsewhere classified: Secondary | ICD-10-CM | POA: Diagnosis not present

## 2016-07-26 DIAGNOSIS — F988 Other specified behavioral and emotional disorders with onset usually occurring in childhood and adolescence: Secondary | ICD-10-CM | POA: Insufficient documentation

## 2016-07-26 DIAGNOSIS — N912 Amenorrhea, unspecified: Secondary | ICD-10-CM

## 2016-07-26 DIAGNOSIS — N920 Excessive and frequent menstruation with regular cycle: Secondary | ICD-10-CM

## 2016-07-26 LAB — HCG, QUANTITATIVE, PREGNANCY: HCG, BETA CHAIN, QUANT, S: 46.5 m[IU]/mL — AB

## 2016-07-26 LAB — POCT URINALYSIS DIPSTICK
BILIRUBIN UA: NEGATIVE
Glucose, UA: NEGATIVE
Ketones, UA: NEGATIVE
LEUKOCYTES UA: NEGATIVE
NITRITE UA: NEGATIVE
PH UA: 5 (ref 5.0–8.0)
PROTEIN UA: NEGATIVE
UROBILINOGEN UA: NEGATIVE U/dL — AB

## 2016-07-26 LAB — CBC
HCT: 43.6 % (ref 35.0–45.0)
Hemoglobin: 14.4 g/dL (ref 11.7–15.5)
MCH: 28.7 pg (ref 27.0–33.0)
MCHC: 33 g/dL (ref 32.0–36.0)
MCV: 86.9 fL (ref 80.0–100.0)
MPV: 9.2 fL (ref 7.5–12.5)
Platelets: 290 10*3/uL (ref 140–400)
RBC: 5.02 MIL/uL (ref 3.80–5.10)
RDW: 12.8 % (ref 11.0–15.0)
WBC: 6 10*3/uL (ref 3.8–10.8)

## 2016-07-26 LAB — POCT URINE PREGNANCY: PREG TEST UR: POSITIVE — AB

## 2016-07-26 NOTE — Telephone Encounter (Signed)
Spoke with patient. Advised of results and message as seen below from [employer] CNM. Patient is agreeable and verbalizes understanding.  Notes recorded by Verner Chol, CNM on 07/26/2016 at 3:09 PM EDT Please notify patient that HCG is showing positive pregnancy, but not consistent with her weeks at this point. She was unsure of dates, so may be really early. Needs to follow instructions given and keep lab/OV appointment on Monday. Also bring her blood donor cards with blood type for her and spouse. Dr. Hyacinth Meeker is aware of her status and is on call this weekend if any changes. CBC is normal  Cc: Dr.Miller  Routing to provider for final review. Patient agreeable to disposition. Will close encounter.

## 2016-07-26 NOTE — Telephone Encounter (Signed)
Routing to Deborah Leonard CNM for review and advise. 

## 2016-07-26 NOTE — Progress Notes (Signed)
36 y.o. married white  female g0p0 presents with amenorrhea with light + UPT on 05/25/16, LMP 06/18/16. Planned pregnancy. Complaining of breast tenderness,  occasional nausea. Patient also noted brown discharge and then slight pink on toliet paper 1 day ago and continues to have spotting with darker appearance today when wiping only with urination. Still not on pad.Mild occasional cramping. Has not taken any medication for cramping.Has increased water intake and has been resting.  Medications she is taking are: prenatal vtiamins, was also Adderrall  but has stopped. . Patient has not consumed alcohol since + UPT. Spouse supportive and with patient today. Patient has 30 to 31 day cycles and had 2 longer cycles ? Thought she was pregnant, but no confirmation with UPT.Period started each times, which was normal. No other problems. "Just concerned". Patient Blood Type O positive and Spouse A positive per patient. Has donor cards at home, will bring in if needed.  ROS Pertinent to HPI   O: Healthy WDWN female Affect: normal, orientation x 3 Exam: skin: warm and dry Abdomen: soft, non tender Inguinal lymph nodes: no enlargement or tenderness Pelvic exam:External :normal female, no lesions BUS: negative Bladder and urethral meatus non tender Vagina: normal appearance with brown discharge noted in vaginal vault Cervix: non tender, nulliparous with scant brown discharge from cervix Uterus: anteverted,soft, non tender Adnexa: non tender, no masses palpated Rectal area: normal appearance  Last Aex:11/29/15 Pap smear: 11/29/15 negative            Rubella screen: not done  A: Amenorrhea with positive UPT  5 wk 3 days per LNMP 06/18/16(not exact date) with [facility] 03/25/17. Planned pregnancy Spotting with brown discharge only Normal pelvic exam  P: Reviewed with patient importance of prenatal care during pregnancy.  Reviewed nutrition importance of pregnancy and selecting from all food groups and making sure  to have adequate protein intake daily.  to  Discussed concerns with FAS with alcohol use in pregnancy.   Reviewed warning signs of early pregnancy and need to advise if occurs. Discussed brown discharge finding and etiology. Discussed concerns with slight cramping of ? SAB vs normal progression of pregnancy. Recommend lab to confirm HCG level and pregnancy in addition UPT today which was positive. Patient agreeable. Will need her to bring blood type card for confirmation. Discussed will need to repeat lab again in 3 days unless status changes to confirm progression of pregnancy. Questions addressed. Encouraged to rest and eat normally . Advised no long distance travel today, due to her concerns.Discussed comfort measures for early pregnancy changes. Questions addressed at length. Discussed someone on call for our practice all the time and I will advise Dr. Hyacinth Meeker of her status . She is aware to call if pain increases, bleeding increases and is red or has clotting.Marland Kitchen Spouse verbalized understanding also.  Labs: HCG quant. CBC  Rv Visit 3 days for repeat HCG quant. and OV.

## 2016-07-26 NOTE — Telephone Encounter (Signed)
Spoke with Delaney Meigs at [employer]. STAT labs available for patient from today's visit. CBC is normal. HCG quantitative is 46.5.Results in EPIC. Routing to [employer] CNM for review and advise.

## 2016-07-26 NOTE — Patient Instructions (Addendum)
Warning signs would be increase bleeding or passing clots, increase cramping. If bleeding increases and becomes bright red. You should call and let us know. Increase water intake and be restful today. You will be called with lab results once they are in. If you are soaking more than a pad in one hour this is a concern and you should be seen at [facility]  [facility]. And call the person on call. Any questions please call. Will repeat your lab again on Monday and have OV with Shirlyn Goltz.

## 2016-07-26 NOTE — Telephone Encounter (Signed)
    Patient calling for lab results 

## 2016-07-26 NOTE — Progress Notes (Signed)
Reviewed personally.  M. Suzanne Shiva Karis, MD.  

## 2016-07-29 ENCOUNTER — Inpatient Hospital Stay (HOSPITAL_COMMUNITY): Payer: Medicaid Other

## 2016-07-29 ENCOUNTER — Other Ambulatory Visit: Payer: Self-pay | Admitting: *Deleted

## 2016-07-29 ENCOUNTER — Encounter: Payer: Self-pay | Admitting: Nurse Practitioner

## 2016-07-29 ENCOUNTER — Telehealth: Payer: Self-pay | Admitting: Obstetrics and Gynecology

## 2016-07-29 ENCOUNTER — Ambulatory Visit: Payer: Self-pay | Admitting: Nurse Practitioner

## 2016-07-29 ENCOUNTER — Observation Stay (HOSPITAL_COMMUNITY)
Admission: AD | Admit: 2016-07-29 | Discharge: 2016-07-30 | Disposition: A | Discharge status: Home / Self Care | Payer: Medicaid Other | Source: Ambulatory Visit | Attending: Obstetrics and Gynecology | Admitting: Obstetrics and Gynecology

## 2016-07-29 ENCOUNTER — Encounter (HOSPITAL_COMMUNITY): Payer: Self-pay | Admitting: Student

## 2016-07-29 ENCOUNTER — Telehealth: Payer: Self-pay | Admitting: Nurse Practitioner

## 2016-07-29 ENCOUNTER — Other Ambulatory Visit: Payer: Self-pay

## 2016-07-29 VITALS — BP 100/66 | HR 96 | Resp 16 | Ht 67.25 in | Wt 188.0 lb

## 2016-07-29 DIAGNOSIS — N912 Amenorrhea, unspecified: Secondary | ICD-10-CM

## 2016-07-29 DIAGNOSIS — O208 Other hemorrhage in early pregnancy: Secondary | ICD-10-CM | POA: Diagnosis not present

## 2016-07-29 DIAGNOSIS — Z3A01 Less than 8 weeks gestation of pregnancy: Secondary | ICD-10-CM | POA: Insufficient documentation

## 2016-07-29 DIAGNOSIS — N920 Excessive and frequent menstruation with regular cycle: Secondary | ICD-10-CM | POA: Diagnosis not present

## 2016-07-29 DIAGNOSIS — O26899 Other specified pregnancy related conditions, unspecified trimester: Secondary | ICD-10-CM

## 2016-07-29 DIAGNOSIS — R1031 Right lower quadrant pain: Secondary | ICD-10-CM | POA: Diagnosis not present

## 2016-07-29 DIAGNOSIS — N939 Abnormal uterine and vaginal bleeding, unspecified: Secondary | ICD-10-CM

## 2016-07-29 DIAGNOSIS — O009 Unspecified ectopic pregnancy without intrauterine pregnancy: Principal | ICD-10-CM | POA: Insufficient documentation

## 2016-07-29 DIAGNOSIS — R109 Unspecified abdominal pain: Secondary | ICD-10-CM

## 2016-07-29 DIAGNOSIS — O200 Threatened abortion: Secondary | ICD-10-CM

## 2016-07-29 DIAGNOSIS — O9989 Other specified diseases and conditions complicating pregnancy, childbirth and the puerperium: Secondary | ICD-10-CM | POA: Diagnosis not present

## 2016-07-29 LAB — CBC
HEMATOCRIT: 44.4 % (ref 36.0–46.0)
HEMOGLOBIN: 15.1 g/dL — AB (ref 12.0–15.0)
MCH: 29.7 pg (ref 26.0–34.0)
MCHC: 34 g/dL (ref 30.0–36.0)
MCV: 87.4 fL (ref 78.0–100.0)
PLATELETS: 314 10*3/uL (ref 150–400)
RBC: 5.08 MIL/uL (ref 3.87–5.11)
RDW: 12.8 % (ref 11.5–15.5)
WBC: 10 10*3/uL (ref 4.0–10.5)

## 2016-07-29 LAB — HCG, QUANTITATIVE, PREGNANCY
HCG, BETA CHAIN, QUANT, S: 46 m[IU]/mL — AB (ref <5)
hCG, Beta Chain, Quant, S: 44.3 m[IU]/mL — ABNORMAL HIGH

## 2016-07-29 NOTE — Telephone Encounter (Signed)
Call to patient to clarify ultrasound appointment is at 130 and needs to arrive at 115.
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
## 2016-07-29 NOTE — Telephone Encounter (Signed)
Phone call returned from the patient.   Being followed for hCG levels and was scheduled to come in tomorrow for a stat quant hCG and ultrasound.   Having pain in her abdomen and lower back.  Unclear where the pain is, maybe on the right side.  Feels like it is a bad period.  Increased bleeding.  States she has a hx of chronic back pain.  Hx of herniated disc.  I recommend patient go to hospital now and have evaluation for possible ectopic pregnancy: hCG, CBC, a pelvic ultrasound. Her husband is with her and will take her to hospital now.

## 2016-07-29 NOTE — MAU Note (Signed)
Urine sent to lab 

## 2016-07-29 NOTE — Telephone Encounter (Signed)
Pt is notified of test results from Friday to today not a lot of change and values did not go higher.  We have concerns about tubal pregnancy.  She is given tubal pregnancy warnings and signs.  She will come in the am at 8:30 and get another stat HCG and then return at 12:45 for PUS with Dr. Hyacinth Meeker.  Both Dr. Hyacinth Meeker and Dr. Edward Jolly are aware of her status. Order for HCG is entered.

## 2016-07-29 NOTE — Progress Notes (Signed)
Encounter reviewed by Dr. Brook Amundson C. Silva.  

## 2016-07-29 NOTE — Patient Instructions (Signed)
We will call you with results of test 

## 2016-07-29 NOTE — MAU Note (Signed)
Pt states that she started having some spotting on Friday and throughout the weekend, but tonight the bleeding got much heavier. Pt states she is having a lot of back pain and lower abdominal pain. Rates 9/10. States she has not taken anything for pain.

## 2016-07-29 NOTE — Telephone Encounter (Addendum)
Delaney Meigs calling from [place] with STAT labs:  Hcg quant 44.3  Available for review in EPIC.  Routing to Ria Comment, NP for review, as Leota Sauers, CNM is out of the office.   Cc: Leota Sauers, CNM

## 2016-07-30 ENCOUNTER — Other Ambulatory Visit: Payer: Medicaid Other

## 2016-07-30 ENCOUNTER — Other Ambulatory Visit: Payer: Self-pay | Admitting: Obstetrics & Gynecology

## 2016-07-30 ENCOUNTER — Other Ambulatory Visit: Payer: Self-pay

## 2016-07-30 DIAGNOSIS — O009 Unspecified ectopic pregnancy without intrauterine pregnancy: Secondary | ICD-10-CM

## 2016-07-30 HISTORY — DX: Unspecified ectopic pregnancy without intrauterine pregnancy: O00.90

## 2016-07-30 LAB — CBC
HCT: 38.1 % (ref 36.0–46.0)
HEMOGLOBIN: 12.7 g/dL (ref 12.0–15.0)
MCH: 29.1 pg (ref 26.0–34.0)
MCHC: 33.3 g/dL (ref 30.0–36.0)
MCV: 87.4 fL (ref 78.0–100.0)
Platelets: 250 10*3/uL (ref 150–400)
RBC: 4.36 MIL/uL (ref 3.87–5.11)
RDW: 12.9 % (ref 11.5–15.5)
WBC: 7 10*3/uL (ref 4.0–10.5)

## 2016-07-30 LAB — CREATININE, SERUM: Creatinine, Ser: 0.63 mg/dL (ref 0.44–1.00)

## 2016-07-30 LAB — ABO/RH: ABO/RH(D): O POS

## 2016-07-30 LAB — TYPE AND SCREEN
ABO/RH(D): O POS
ANTIBODY SCREEN: NEGATIVE

## 2016-07-30 LAB — AST: AST: 18 U/L (ref 15–41)

## 2016-07-30 LAB — BUN: BUN: 12 mg/dL (ref 6–20)

## 2016-07-30 MED ORDER — SODIUM CHLORIDE 0.9 % IV SOLN: 250.0000 mL | INTRAVENOUS | Status: DC | PRN

## 2016-07-30 MED ORDER — SODIUM CHLORIDE 0.9% FLUSH
3.0000 mL | Freq: Two times a day (BID) | INTRAVENOUS | Status: DC
  Administered 2016-07-30: 3 mL via INTRAVENOUS

## 2016-07-30 MED ORDER — SODIUM CHLORIDE 0.9% FLUSH
3.0000 mL | INTRAVENOUS | Status: DC | PRN
  Administered 2016-07-30: 3 mL via INTRAVENOUS
  Filled 2016-07-30: qty 3

## 2016-07-30 MED ORDER — PRENATAL MULTIVITAMIN CH: 1.0000 | ORAL_TABLET | Freq: Every day | ORAL | Status: DC

## 2016-07-30 MED ORDER — ONDANSETRON HCL 4 MG PO TABS: 4.0000 mg | ORAL_TABLET | Freq: Three times a day (TID) | ORAL | 0 refills | Status: DC | PRN

## 2016-07-30 MED ORDER — ONDANSETRON HCL 4 MG/2ML IJ SOLN: 4.0000 mg | Freq: Four times a day (QID) | INTRAMUSCULAR | 0 refills | Status: DC | PRN

## 2016-07-30 MED ORDER — LACTATED RINGERS IV SOLN
INTRAVENOUS | Status: DC
  Administered 2016-07-30 (×2): via INTRAVENOUS

## 2016-07-30 MED ORDER — ONDANSETRON HCL 4 MG/2ML IJ SOLN
4.0000 mg | Freq: Four times a day (QID) | INTRAMUSCULAR | Status: DC | PRN
  Administered 2016-07-30: 4 mg via INTRAVENOUS
  Filled 2016-07-30: qty 2

## 2016-07-30 MED ORDER — MORPHINE SULFATE (PF) 4 MG/ML IV SOLN
1.0000 mg | Freq: Once | INTRAVENOUS | Status: AC
  Administered 2016-07-30: 1 mg via INTRAVENOUS
  Filled 2016-07-30: qty 1

## 2016-07-30 MED ORDER — METHOTREXATE INJECTION FOR WOMEN'S HOSPITAL
50.0000 mg/m2 | Freq: Once | INTRAMUSCULAR | Status: AC
  Administered 2016-07-30: 100 mg via INTRAMUSCULAR
  Filled 2016-07-30: qty 2

## 2016-07-30 NOTE — Progress Notes (Signed)
CSW received consult for questions about getting Medicaid.  CSW is unable to assist as this is handled by the [employer].  CSW contacted R. South/Financial Counselor to inform her of consult.  CSW is screening out consult at this time.

## 2016-07-30 NOTE — Progress Notes (Signed)
Pt. Is discharged in the care of friend. Downstairs per ambulatory. Denies pain or discomfort. Emotional support given Stable. Denies excessive bleeding.
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
## 2016-07-30 NOTE — Discharge Instructions (Signed)
Methotrexate Treatment for an Ectopic Pregnancy, Care After °Refer to this sheet in the next few weeks. These instructions provide you with information on caring for yourself after your procedure. Your health care provider may also give you more specific instructions. Your treatment has been planned according to current medical practices, but problems sometimes occur. Call your health care provider if you have any problems or questions after your procedure. °What can I expect after the procedure? °You may have some abdominal cramping, vaginal bleeding, and fatigue in the first few days after taking methotrexate. Some other possible side effects of methotrexate include: °· Nausea. °· Vomiting. °· Diarrhea. °· Mouth sores. °· Swelling or irritation of the lining of your lungs (pneumonitis). °· Liver damage. °· Hair loss. ° °Follow these instructions at home: °After you have received the methotrexate medicine, you need to be careful of your activities and watch your condition for several weeks. It may take 1 week before your hormone levels return to normal. °Activity °· Do not have sexual intercourse until your health care provider says it is safe to do so. °· You may resume your usual diet. °· Limit strenuous activity. °· Do not drink alcohol. °General instructions °· Do not take aspirin, ibuprofen, or naproxen (nonsteroidal anti-inflammatory drugs [NSAIDs]). °· Do not take folic acid, prenatal vitamins, or other vitamins that contain folic acid. °· Avoid traveling too far away from your health care provider. °· Keep all follow-up visits as told by your health care provider. This is important. °Contact a health care provider if: °· You cannot control your nausea and vomiting. °· You cannot control your diarrhea. °· You have sores in your mouth and want treatment. °· You need pain medicine for your abdominal pain. °· You have a rash. °· You are having a reaction to the medicine. °Get help right away if: °· You have  increasing abdominal or pelvic pain. °· You notice increased bleeding. °· You feel light-headed, or you faint. °· You have shortness of breath. °· Your heart rate increases. °· You have a cough. °· You have chills. °· You have a fever. °This information is not intended to replace advice given to you by your health care provider. Make sure you discuss any questions you have with your health care provider. °Document Released: 03/07/2011 Document Revised: 08/24/2015 Document Reviewed: 01/04/2013 °Elsevier Interactive Patient Education © 2017 Elsevier Inc. ° °

## 2016-07-30 NOTE — Progress Notes (Signed)
Hospital Day number 0  Subjective: Patient reports nausea and no problems voiding.   Zofran helps nausea.  Pain and cramping is much better.  Minimal bleeding.  Feels ready to go home.   Objective: I have reviewed patient's vital signs, intake and output and labs. Vitals:   07/30/16 0800 07/30/16 1206  BP: 107/62 103/61  Pulse: 61 (!) 57  Resp: 20 20  Temp: 98.6 F (37 C) 98.1 F (36.7 C)   Hgb this am 12. 7. WBC 7.0.  General: alert and cooperative Resp: clear to auscultation bilaterally Cardio: regular rate and rhythm, S1, S2 normal, no murmur, click, rub or gallop GI: soft with mild tenderness of left lower quadrand and suprapubic area.  No guarding or rebound.  Vaginal Bleeding: minimal  Assessment: Status post MTX for suspected ectopic pregnancy.  Stable low and nonrising hCGs.  Patient is stable and clinically improved overnight.  No surgical abdomen.   Plan: Discharge home Regular diet.  MTX precautions and ectopic precautions given.  No PNV and no NSAIDs.  Tynelol for pain.  Zofran for nausea.  Follow up hCG in 4 days and then in 7 days.  We have discussed possible risk of future ectopic pregnancy and the importance of early prenatal care.    LOS: 0 days    Melony Overly 07/30/2016, 12:41 PM

## 2016-08-02 ENCOUNTER — Other Ambulatory Visit (HOSPITAL_COMMUNITY)
Admission: AD | Admit: 2016-08-02 | Discharge: 2016-08-02 | Disposition: A | Discharge status: Home / Self Care | Payer: Medicaid Other | Source: Ambulatory Visit | Attending: Obstetrics and Gynecology | Admitting: Obstetrics and Gynecology

## 2016-08-02 ENCOUNTER — Telehealth: Payer: Self-pay | Admitting: Obstetrics and Gynecology

## 2016-08-02 ENCOUNTER — Telehealth: Payer: Self-pay

## 2016-08-02 DIAGNOSIS — N939 Abnormal uterine and vaginal bleeding, unspecified: Secondary | ICD-10-CM | POA: Diagnosis not present

## 2016-08-02 LAB — HCG, QUANTITATIVE, PREGNANCY: HCG, BETA CHAIN, QUANT, S: 55 m[IU]/mL — AB (ref <5)

## 2016-08-02 NOTE — Telephone Encounter (Signed)
Spoke with [place] [address] time of incoming call. [facility] is [facility] for follow up hcg level and there is no order. Betsy requests lab be faxed to her attention at [phone]. Lab order for STAT hcg quantitative, pregnancy written and signed by Dr.Silva. Faxed to [phone].  Routing to provider for final review. Patient agreeable to disposition. Will close encounter.

## 2016-08-02 NOTE — Telephone Encounter (Signed)
Phone call with the patient regarding her first hCG level status post methotrexate for abnormally rising hCGs and suspected ectopic pregnancy.   Patient feels more emotional today. Bleeding stopped.  Notes more breast tenderness.  Abdomen is "sore like she did 8000 crunches."  hCG is now 55. Day zero MTX injection hCG was 46.   We talked about how this rise is anticipated on day 4 following the injection and that the next level is expected to fall.  Next hCG will be checked on day 7. [facility] draw.   She will call back for any concerns.

## 2016-08-05 ENCOUNTER — Telehealth: Payer: Self-pay | Admitting: Obstetrics and Gynecology

## 2016-08-05 ENCOUNTER — Inpatient Hospital Stay (HOSPITAL_COMMUNITY)
Admission: AD | Admit: 2016-08-05 | Discharge: 2016-08-05 | Disposition: A | Discharge status: Home / Self Care | Payer: Medicaid Other | Source: Ambulatory Visit | Attending: Obstetrics and Gynecology | Admitting: Obstetrics and Gynecology

## 2016-08-05 DIAGNOSIS — Z3492 Encounter for supervision of normal pregnancy, unspecified, second trimester: Secondary | ICD-10-CM | POA: Diagnosis not present

## 2016-08-05 DIAGNOSIS — Z3A14 14 weeks gestation of pregnancy: Secondary | ICD-10-CM | POA: Insufficient documentation

## 2016-08-05 LAB — HCG, QUANTITATIVE, PREGNANCY: HCG, BETA CHAIN, QUANT, S: 33 m[IU]/mL — AB (ref <5)

## 2016-08-05 NOTE — Telephone Encounter (Signed)
HCG is now down to 33.  Her next hCG is due in one week.  This can be done at the office here.   I need to see the patient for further evaluation here if she is not well.

## 2016-08-05 NOTE — Telephone Encounter (Signed)
Patient called stating [facility] was [facility] having [facility] work done and they took an extra vial of blood for an extra panel. She was instructed to let our office know the extra vial was taken.   She is agreeable to a return call and stated there are some results pending for her as well.

## 2016-08-05 NOTE — Telephone Encounter (Signed)
Patient returned your call.

## 2016-08-05 NOTE — Telephone Encounter (Signed)
Spoke with patient. Advised of results and message as seen below from Dr.Silva. Patient states that she is feeling better than over the weekend and will monitor how she is feeling. Will come in if symptoms persist or worsen. Aware she Zenaida Niecevan be seen at the ER overnight if needed. Lab appointment scheduled for 08/12/2016 at 9 am. Patient is agreeable to date and time. States she is constipated and would like to know if she can take Senokot. Advised she should try drinking plenty of fluids, walking around often if possible, eating green leafy vegetables and if needed can take Colace OTC. Advised may take lowest dose of Senokot if needed. Needs not to take high dosage as we do not want her to have diarrhea. Patient verbalizes understanding.  Routing to Dr.Silva for review.

## 2016-08-05 NOTE — Telephone Encounter (Signed)
I agree with your recommendations.  The Zofran can cause constipation.

## 2016-08-05 NOTE — Telephone Encounter (Signed)
Spoke with patient. Patient states [facility] and extra blood was drawn in case Dr.Silva would like to add any labs. 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 Went to rest in a private room and does not remember what happened. States she laid down, but feels she passed out. Reports she may have been slightly dehydrated. Patient is still having dizziness, nausea, and weakness. Denies any vomiting, diarrhea, fever, chills, bleeding, or pain. Patient has been taking Zofran for nausea which helps. States has not been drinking enough water. Advised will need to increase water intake to prevent dehydration and eat small frequent meals to ensure adequate nutrients. Advised will speak with Dr.Silva and return call with further recommendations. Patient is agreeable.

## 2016-08-05 NOTE — Telephone Encounter (Signed)
Left message to call Kaitlyn at [phone]. 

## 2016-08-06 NOTE — Discharge Summary (Signed)
Physician Discharge Summary  Patient ID: Regina Tyler MRN: [id number] DOB/AGE: 10-May-1980 36 y.o.  Admit date: 07/29/2016 Discharge date:  07/30/16  Admission Diagnoses: 1.  Failed early pregnancy.  2.  Suspected ectopic pregnancy.  3.  Right lower quadrant pain.   Discharge Diagnoses:  1.  Failed early pregnancy.  2.  Suspected ectopic pregnancy.  3.  Right lower quadrant pain resolving. 4.  Status post Methotrexate injection.  Active Problems:   Ectopic pregnancy   Discharged Condition: good  Hospital Course:  The patient is a 36 year old Caucasian female G2P0010 with a last menstrual period 06/18/2016 who presented with pevic pain, cramping, and vaginal bleeding in pregnancy.  Patient was being followed through the office for known early pregnancy.  Had office visit on 07/26/16 for cramping and spotting and had an hCG 46.5.  Follow up visit on 07/29/16 showed hCG 44.3.  Patient was to be seen in the am on 07/30/16 in office for a STAT quant hCG and ultrasound in the afternoon.   She called the evening of 07/29/16 with right lower quadrant abdominal pain and pain in her lower back.  She stated her cramping was worse than the right sided pain.  She reported she has chronic back pain as well and hx herniated disc. [facility].    In the Maternity Admissions department the night of admission, her hCG measured 46.  Pelvic ultrasound showed no IUP and normal adnexal regions and no free fluid.  Hgb was 15.1.  Patient blood type O positive and partner is A positive.   Patient was a diagnosis of failed early pregnancy suspicious for an ectopic pregnancy and was given options for care including surgical evaluation with dilation and curettage and laparoscopy with removal of ectopic pregnancy versus methotrexate and hospital admission for observation.  The latter was chosen as she did not have an acute abdomen.  The patient was admitted for  observation and she received a single dose of methotrexate and was made NPO.  She received a single injection of morphine for pain control and Zofran for nausea. She had serial vital signs during the night which were stable, and her pain was significantly improved by the next morning.  Her examination was reassuring that she did not have an acute abdomen.  Her discharge hemoglobin was 12.7.  Consults: None  Significant Diagnostic Studies:  Please see Hospital Course for lab and ultrasound results.   Treatments: Methotrexate injection.   Discharge Exam: Blood pressure 103/61, pulse (!) 57, temperature 98.1 F (36.7 C), temperature source Oral, resp. rate 20, height 5\' 8"  (1.727 m), weight 187 lb (84.8 kg), last menstrual period 06/18/2016, SpO2 100 %. General: alert and cooperative Resp: clear to auscultation bilaterally Cardio: regular rate and rhythm, S1, S2 normal, no murmur, click, rub or gallop GI: soft with mild tenderness of left lower quadrand and suprapubic area.  No guarding or rebound.  Vaginal Bleeding: minimal  Disposition: 01-Home or Self Care   Allergies as of 07/30/2016   No Known Allergies     Medication List    STOP taking these medications   FOLIC ACID PO   PRENATAL GUMMIES/DHA & FA PO   UNABLE TO FIND     TAKE these medications   ondansetron 4 MG tablet Commonly known as:  ZOFRAN Take 1 tablet (4 mg total) by mouth every 8 (eight) hours as needed for nausea or vomiting.      The patient will  follow up through Maternity Admissions for her follow up hGC levels as per the Methotrexate protocol.  She will cal for any heavy vaginal bleeding, increasing pain, or any other concern.   Signed: Melony Overly 08/06/2016, 2:30 PM

## 2016-08-12 ENCOUNTER — Encounter: Payer: Self-pay | Admitting: Obstetrics and Gynecology

## 2016-08-12 ENCOUNTER — Telehealth: Payer: Self-pay | Admitting: *Deleted

## 2016-08-12 ENCOUNTER — Other Ambulatory Visit (INDEPENDENT_AMBULATORY_CARE_PROVIDER_SITE_OTHER): Payer: Medicaid Other

## 2016-08-12 ENCOUNTER — Ambulatory Visit: Payer: Self-pay | Admitting: Obstetrics and Gynecology

## 2016-08-12 VITALS — BP 112/78 | HR 104 | Ht 67.25 in | Wt 188.0 lb

## 2016-08-12 DIAGNOSIS — O200 Threatened abortion: Secondary | ICD-10-CM | POA: Diagnosis not present

## 2016-08-12 DIAGNOSIS — IMO0002 Reserved for concepts with insufficient information to code with codable children: Secondary | ICD-10-CM

## 2016-08-12 DIAGNOSIS — R799 Abnormal finding of blood chemistry, unspecified: Secondary | ICD-10-CM

## 2016-08-12 LAB — HCG, QUANTITATIVE, PREGNANCY: hCG, Beta Chain, Quant, S: 34.8 m[IU]/mL — ABNORMAL HIGH

## 2016-08-12 NOTE — Patient Instructions (Signed)
Please do not eat or drink after midnight!

## 2016-08-12 NOTE — Telephone Encounter (Signed)
Please have the patient come in to the office today. I need to talk with her about doing a [facility]. The hCG number is staying stable and did not really go up or down.   Cc- Regina DaneJill Tyler

## 2016-08-12 NOTE — Telephone Encounter (Signed)
Spoke with patient, advised as seen below per Dr. Edward JollySilva. Patient scheduled for 5/14 at 12:45pm. Patient verbalizes understanding and is agreeable.  Routing to provider for final review. Patient is agreeable to disposition. Will close encounter.
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
## 2016-08-12 NOTE — Telephone Encounter (Addendum)
Delaney Meigsamara calling from [facility] with STAT report-  HCG Quantitative 34.8   Forwarding for review by provider.   Cc: Ria CommentPatricia Grubb, NP

## 2016-08-12 NOTE — Telephone Encounter (Addendum)
Left message to call Nalu Troublefield at [phone].  

## 2016-08-13 ENCOUNTER — Ambulatory Visit (HOSPITAL_COMMUNITY): Payer: Medicaid Other | Admitting: Anesthesiology

## 2016-08-13 ENCOUNTER — Encounter (HOSPITAL_COMMUNITY): Payer: Self-pay

## 2016-08-13 ENCOUNTER — Encounter (HOSPITAL_COMMUNITY)
Admission: RE | Disposition: A | Discharge status: Home / Self Care | Payer: Self-pay | Source: Ambulatory Visit | Attending: Obstetrics and Gynecology

## 2016-08-13 ENCOUNTER — Ambulatory Visit (HOSPITAL_COMMUNITY)
Admission: RE | Admit: 2016-08-13 | Discharge: 2016-08-13 | Disposition: A | Discharge status: Home / Self Care | Payer: Medicaid Other | Source: Ambulatory Visit | Attending: Obstetrics and Gynecology | Admitting: Obstetrics and Gynecology

## 2016-08-13 DIAGNOSIS — R7989 Other specified abnormal findings of blood chemistry: Secondary | ICD-10-CM | POA: Diagnosis not present

## 2016-08-13 DIAGNOSIS — O0281 Inappropriate change in quantitative human chorionic gonadotropin (hCG) in early pregnancy: Secondary | ICD-10-CM | POA: Diagnosis not present

## 2016-08-13 HISTORY — PX: DILATION AND EVACUATION: SHX1459

## 2016-08-13 LAB — CBC
HCT: 43.4 % (ref 36.0–46.0)
Hemoglobin: 14.4 g/dL (ref 12.0–15.0)
MCH: 29.1 pg (ref 26.0–34.0)
MCHC: 33.2 g/dL (ref 30.0–36.0)
MCV: 87.7 fL (ref 78.0–100.0)
PLATELETS: 278 10*3/uL (ref 150–400)
RBC: 4.95 MIL/uL (ref 3.87–5.11)
RDW: 13.1 % (ref 11.5–15.5)
WBC: 6.3 10*3/uL (ref 4.0–10.5)

## 2016-08-13 SURGERY — DILATION AND EVACUATION, UTERUS
Anesthesia: Monitor Anesthesia Care | Site: Vagina

## 2016-08-13 MED ORDER — ACETAMINOPHEN 160 MG/5ML PO SOLN
975.0000 mg | Freq: Once | ORAL | Status: AC
  Administered 2016-08-13: 975 mg via ORAL

## 2016-08-13 MED ORDER — SCOPOLAMINE 1 MG/3DAYS TD PT72
MEDICATED_PATCH | TRANSDERMAL | Status: AC
  Filled 2016-08-13: qty 1

## 2016-08-13 MED ORDER — ONDANSETRON HCL 4 MG/2ML IJ SOLN
INTRAMUSCULAR | Status: DC | PRN
  Administered 2016-08-13: 4 mg via INTRAVENOUS

## 2016-08-13 MED ORDER — FENTANYL CITRATE (PF) 100 MCG/2ML IJ SOLN
INTRAMUSCULAR | Status: AC
Start: 2016-08-13 — End: ?
  Filled 2016-08-13: qty 2

## 2016-08-13 MED ORDER — DOXYCYCLINE HYCLATE 100 MG PO TABS
ORAL_TABLET | ORAL | Status: AC
  Filled 2016-08-13: qty 1

## 2016-08-13 MED ORDER — LIDOCAINE HCL (CARDIAC) 20 MG/ML IV SOLN
INTRAVENOUS | Status: DC | PRN
  Administered 2016-08-13: 70 mg via INTRAVENOUS
  Administered 2016-08-13: 30 mg via INTRAVENOUS

## 2016-08-13 MED ORDER — DOXYCYCLINE HYCLATE 100 MG PO TABS
100.0000 mg | ORAL_TABLET | Freq: Once | ORAL | Status: AC
  Administered 2016-08-13: 100 mg via ORAL

## 2016-08-13 MED ORDER — FENTANYL CITRATE (PF) 100 MCG/2ML IJ SOLN
INTRAMUSCULAR | Status: AC
  Filled 2016-08-13: qty 2

## 2016-08-13 MED ORDER — MEPERIDINE HCL 25 MG/ML IJ SOLN
INTRAMUSCULAR | Status: AC
  Filled 2016-08-13: qty 1

## 2016-08-13 MED ORDER — DEXAMETHASONE SODIUM PHOSPHATE 4 MG/ML IJ SOLN
INTRAMUSCULAR | Status: AC
  Filled 2016-08-13: qty 1

## 2016-08-13 MED ORDER — LIDOCAINE HCL 1 % IJ SOLN
INTRAMUSCULAR | Status: DC | PRN
  Administered 2016-08-13: 10 mL

## 2016-08-13 MED ORDER — HYDROCODONE-ACETAMINOPHEN 5-325 MG PO TABS
ORAL_TABLET | ORAL | Status: AC
  Filled 2016-08-13: qty 1

## 2016-08-13 MED ORDER — ONDANSETRON HCL 4 MG/2ML IJ SOLN
INTRAMUSCULAR | Status: AC
  Filled 2016-08-13: qty 2

## 2016-08-13 MED ORDER — MIDAZOLAM HCL 2 MG/2ML IJ SOLN
INTRAMUSCULAR | Status: AC
  Filled 2016-08-13: qty 2

## 2016-08-13 MED ORDER — FENTANYL CITRATE (PF) 100 MCG/2ML IJ SOLN
INTRAMUSCULAR | Status: AC
  Administered 2016-08-13: 25 ug via INTRAVENOUS
  Filled 2016-08-13: qty 2

## 2016-08-13 MED ORDER — SCOPOLAMINE 1 MG/3DAYS TD PT72
1.0000 | MEDICATED_PATCH | Freq: Once | TRANSDERMAL | Status: DC
  Administered 2016-08-13: 1.5 mg via TRANSDERMAL

## 2016-08-13 MED ORDER — IBUPROFEN 800 MG PO TABS: 800.0000 mg | ORAL_TABLET | Freq: Three times a day (TID) | ORAL | 0 refills | Status: DC | PRN

## 2016-08-13 MED ORDER — KETOROLAC TROMETHAMINE 30 MG/ML IJ SOLN
INTRAMUSCULAR | Status: AC
  Filled 2016-08-13: qty 1

## 2016-08-13 MED ORDER — HYDROCODONE-ACETAMINOPHEN 5-325 MG PO TABS
1.0000 | ORAL_TABLET | Freq: Once | ORAL | Status: AC
  Administered 2016-08-13: 1 via ORAL

## 2016-08-13 MED ORDER — KETOROLAC TROMETHAMINE 30 MG/ML IJ SOLN
INTRAMUSCULAR | Status: DC | PRN
  Administered 2016-08-13: 30 mg via INTRAVENOUS

## 2016-08-13 MED ORDER — MEPERIDINE HCL 25 MG/ML IJ SOLN
6.2500 mg | INTRAMUSCULAR | Status: DC | PRN
  Administered 2016-08-13: 6.25 mg via INTRAVENOUS

## 2016-08-13 MED ORDER — PROPOFOL 10 MG/ML IV BOLUS
INTRAVENOUS | Status: AC
  Filled 2016-08-13: qty 20

## 2016-08-13 MED ORDER — PROPOFOL 500 MG/50ML IV EMUL
INTRAVENOUS | Status: DC | PRN
  Administered 2016-08-13 (×9): 20 mg via INTRAVENOUS

## 2016-08-13 MED ORDER — MIDAZOLAM HCL 2 MG/2ML IJ SOLN
INTRAMUSCULAR | Status: DC | PRN
  Administered 2016-08-13: 2 mg via INTRAVENOUS

## 2016-08-13 MED ORDER — FENTANYL CITRATE (PF) 100 MCG/2ML IJ SOLN
25.0000 ug | INTRAMUSCULAR | Status: DC | PRN
  Administered 2016-08-13 (×2): 25 ug via INTRAVENOUS

## 2016-08-13 MED ORDER — ACETAMINOPHEN 160 MG/5ML PO SOLN
ORAL | Status: AC
  Filled 2016-08-13: qty 40.6

## 2016-08-13 MED ORDER — LIDOCAINE HCL (CARDIAC) 20 MG/ML IV SOLN
INTRAVENOUS | Status: AC
  Filled 2016-08-13: qty 5

## 2016-08-13 MED ORDER — DEXAMETHASONE SODIUM PHOSPHATE 10 MG/ML IJ SOLN
INTRAMUSCULAR | Status: DC | PRN
  Administered 2016-08-13: 4 mg via INTRAVENOUS

## 2016-08-13 MED ORDER — LACTATED RINGERS IV SOLN
INTRAVENOUS | Status: DC
  Administered 2016-08-13: 125 mL/h via INTRAVENOUS

## 2016-08-13 MED ORDER — FENTANYL CITRATE (PF) 100 MCG/2ML IJ SOLN
INTRAMUSCULAR | Status: DC | PRN
  Administered 2016-08-13 (×4): 50 ug via INTRAVENOUS

## 2016-08-13 MED ORDER — PROMETHAZINE HCL 25 MG/ML IJ SOLN: 6.2500 mg | INTRAMUSCULAR | Status: DC | PRN

## 2016-08-13 MED ORDER — LACTATED RINGERS IV SOLN
INTRAVENOUS | Status: DC
  Administered 2016-08-13 (×2): via INTRAVENOUS

## 2016-08-13 SURGICAL SUPPLY — 20 items
CATH ROBINSON RED A/P 16FR (CATHETERS) ×2 IMPLANT
CLOTH BEACON ORANGE TIMEOUT ST (SAFETY) ×2 IMPLANT
DECANTER SPIKE VIAL GLASS SM (MISCELLANEOUS) ×2 IMPLANT
GLOVE BIO SURGEON STRL SZ 6.5 (GLOVE) ×2 IMPLANT
GLOVE BIOGEL PI IND STRL 7.0 (GLOVE) ×1 IMPLANT
GLOVE BIOGEL PI INDICATOR 7.0 (GLOVE) ×1
GOWN STRL REUS W/TWL LRG LVL3 (GOWN DISPOSABLE) ×4 IMPLANT
KIT BERKELEY 1ST TRIMESTER 3/8 (MISCELLANEOUS) ×2 IMPLANT
NS IRRIG 1000ML POUR BTL (IV SOLUTION) ×2 IMPLANT
PACK VAGINAL MINOR WOMEN LF (CUSTOM PROCEDURE TRAY) ×2 IMPLANT
PAD OB MATERNITY 4.3X12.25 (PERSONAL CARE ITEMS) ×2 IMPLANT
PAD PREP 24X48 CUFFED NSTRL (MISCELLANEOUS) ×2 IMPLANT
SET BERKELEY SUCTION TUBING (SUCTIONS) ×2 IMPLANT
TOWEL OR 17X24 6PK STRL BLUE (TOWEL DISPOSABLE) ×4 IMPLANT
TUBING AQUILEX INFLOW (TUBING) IMPLANT
TUBING AQUILEX OUTFLOW (TUBING) IMPLANT
VACURETTE 10 RIGID CVD (CANNULA) IMPLANT
VACURETTE 7MM CVD STRL WRAP (CANNULA) IMPLANT
VACURETTE 8 RIGID CVD (CANNULA) IMPLANT
VACURETTE 9 RIGID CVD (CANNULA) IMPLANT
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
## 2016-08-13 NOTE — Anesthesia Postprocedure Evaluation (Signed)
Anesthesia Post Note  Patient: Regina Tyler  Procedure(s) Performed: Procedure(s) (LRB): DILATATION AND EVACUATION (N/A)  Patient location during evaluation: PACU Anesthesia Type: MAC Level of consciousness: awake and alert Pain management: pain level controlled Vital Signs Assessment: post-procedure vital signs reviewed and stable Respiratory status: spontaneous breathing, nonlabored ventilation, respiratory function stable and patient connected to nasal cannula oxygen Cardiovascular status: stable and blood pressure returned to baseline Anesthetic complications: no        Last Vitals:  Vitals:   08/13/16 1215 08/13/16 1252  BP:  104/61  Pulse: 78 65  Resp: 18 20  Temp: 37.2 C     Last Pain:  Vitals:   08/13/16 1252  TempSrc:   PainSc: 4    Pain Goal: Patients Stated Pain Goal: 3 (08/13/16 0847)               Karyl Kinnier Fransisca Shawn

## 2016-08-13 NOTE — Anesthesia Preprocedure Evaluation (Addendum)
Anesthesia Evaluation  Patient identified by MRN, date of birth, ID band Patient awake    Reviewed: Allergy & Precautions, NPO status , Patient's Chart, lab work & pertinent test results  Airway Mallampati: II  TM Distance: >3 FB Neck ROM: Full    Dental  (+) Teeth Intact, Dental Advisory Given, Caps,    Pulmonary neg pulmonary ROS,    Pulmonary exam normal breath sounds clear to auscultation       Cardiovascular negative cardio ROS Normal cardiovascular exam Rhythm:Regular Rate:Normal     Neuro/Psych  Headaches, PSYCHIATRIC DISORDERS Anxiety Depression    GI/Hepatic negative GI ROS, Neg liver ROS,   Endo/Other  negative endocrine ROS  Renal/GU negative Renal ROS     Musculoskeletal negative musculoskeletal ROS (+)   Abdominal   Peds  Hematology   Anesthesia Other Findings Day of surgery medications reviewed with the patient.  Reproductive/Obstetrics (+) Pregnancy                             Anesthesia Physical Anesthesia Plan  ASA: II  Anesthesia Plan: MAC   Post-op Pain Management:    Induction: Intravenous  Airway Management Planned: Nasal Cannula  Additional Equipment:   Intra-op Plan:   Post-operative Plan:   Informed Consent: I have reviewed the patients History and Physical, chart, labs and discussed the procedure including the risks, benefits and alternatives for the proposed anesthesia with the patient or authorized representative who has indicated his/her understanding and acceptance.   Dental advisory given  Plan Discussed with: CRNA and Anesthesiologist  Anesthesia Plan Comments: (Discussed risks/benefits/alternatives to MAC sedation including need for ventilatory support, hypotension, need for conversion to general anesthesia.  All patient questions answered.  Patient/guardian wishes to proceed.)      Anesthesia Quick Evaluation

## 2016-08-13 NOTE — Brief Op Note (Signed)
08/13/2016  10:28 AM  PATIENT:  Derwood KaplanEmily Scarberry  36 y.o. female  PRE-OPERATIVE DIAGNOSIS:  ABNORNAL CHRIONIC GONADOTROPIN LEVEL, STATUS POST METHOTREXATE  POST-OPERATIVE DIAGNOSIS:  ABNORNAL CHRIONIC GONADOTROPIN LEVEL, STATUS POST METHOTREXATE  PROCEDURE:  Procedure(s): DILATATION AND EVACUATION (N/A)  SURGEON:  Surgeon(s) and Role:    * Amundson Shirley Friar Silva, Kylle Lall E, MD - Primary  PHYSICIAN ASSISTANT:   na  ASSISTANTS: none   ANESTHESIA:   paracervical block  EBL:  Total I/O In: 700 [I.V.:700] Out: 35 [Urine:30; Blood:5]  BLOOD ADMINISTERED:none  DRAINS: none   LOCAL MEDICATIONS USED:  LIDOCAINE  and Amount: 10 ml  SPECIMEN:  Source of Specimen:  products of conception   DISPOSITION OF SPECIMEN:  PATHOLOGY  COUNTS:  YES  TOURNIQUET:  * No tourniquets in log *  DICTATION: .Other Dictation: Dictation Number    PLAN OF CARE: Discharge to home after PACU  PATIENT DISPOSITION:  PACU - hemodynamically stable.   Delay start of Pharmacological VTE agent (>24hrs) due to surgical blood loss or risk of bleeding: not applicable

## 2016-08-13 NOTE — Transfer of Care (Signed)
Immediate Anesthesia Transfer of Care Note  Patient: Regina Tyler  Procedure(s) Performed: Procedure(s): DILATATION AND EVACUATION (N/A)  Patient Location: PACU  Anesthesia Type:MAC  Level of Consciousness: awake, alert , oriented and patient cooperative  Airway & Oxygen Therapy: Patient Spontanous Breathing  Post-op Assessment: Report given to RN and Post -op Vital signs reviewed and stable  Post vital signs: Reviewed and stable  Last Vitals:  Vitals:   08/13/16 0847  BP: 116/74  Pulse: 91  Resp: 20  Temp: 36.8 C    Last Pain:  Vitals:   08/13/16 0847  TempSrc: Oral  PainSc: 2       Patients Stated Pain Goal: 3 (47/42/59 5638)  Complications: No apparent anesthesia complications

## 2016-08-13 NOTE — Op Note (Signed)
NAMEANALUCIA, HUSH NO.:  [id number]  MEDICAL RECORD NO.:  [id number]  LOCATION:                                 FACILITY:  PHYSICIAN:  Randye Lobo, M.D.        DATE OF BIRTH:  DATE OF PROCEDURE:  08/13/2016 DATE OF DISCHARGE:                              OPERATIVE REPORT   PREOPERATIVE DIAGNOSES: 1. Abnormal quantitative beta hCG level. 2. Status post methotrexate.  POSTOPERATIVE DIAGNOSES: 1. Abnormal quantitative beta hCG level. 2. Status post methotrexate.  PROCEDURE:  Dilation and evacuation.  SURGEON:  Conley Simmonds, MD.  ANESTHESIA:  IV sedation, paracervical block with 10 mL of 1% lidocaine.  INTRAVENOUS FLUIDS:  700 mL Ringer's lactate.  ESTIMATED BLOOD LOSS:  5 mL.  URINE OUTPUT:  30 mL.  COMPLICATIONS:  None.  INDICATIONS FOR THE PROCEDURE:  The patient is a 36 year old Caucasian female, who initially presented on July 26, 2016, in the office for cramping and spotting in early pregnancy.  At that time, she had an hCG level of 46.5.  The patient had a followup visit in the office 3 days later, and her hCG level was 44.3.  The patient presented to the emergency department at [facility] that same evening with cramping in the midline and some right lower quadrant pain.  A repeat hCG level in maternity admissions was 46, and a pelvic ultrasound documented no intrauterine pregnancy and normal adnexal regions bilaterally.  There was no free fluid.  The patient at that time, had no signs of an acute abdomen.  She was diagnosed with a failed early pregnancy and possible ectopic.  Options for care were discussed including surgical evaluation with a dilation and evacuation and laparoscopy versus methotrexate therapy and in-hospital observation.  The patient was hemodynamically stable at that time and a decision was made to proceed with methotrexate and observation.  The patient received 1 dosage of the methotrexate and then have  followup hCG levels per methotrexate protocol.  Her day 4 hCG level was 55.  Her day 7 hCG level was 33 and 1 week later, which was yesterday, her hCG level was 34.8.  The patient continued to have brown spotting and she reported no significant abdominal discomfort other than some mild lower abdominal discomfort, which she was attributing to constipation.  A discussion was held with the patient regarding the lack of response to the Methotrexate, and recommendation was made to proceed with a dilation and evacuation procedure after risks, benefits, and alternatives were reviewed.  FINDINGS:  Exam under anesthesia revealed a small uterus.  No adnexal masses were appreciated.  With a dilation and evacuation procedure, some clot versus products of conception were obtained with a suction machine.  A small amount of additional tissue was obtained with sharp curettage.  DESCRIPTION OF PROCEDURE:  The patient was re-identified in the preoperative hold area.  She received doxycycline 100 mg orally for antibiotic prophylaxis and she received both TED hose and PAS stockings for DVT prophylaxis.  In the operating room, she was placed in the dorsal lithotomy position with the Allen stirrups.  She received IV sedation with propofol.  She was  sterilely prepped and draped and she was catheterized of urine with a red rubber catheter.  An exam under anesthesia was performed.  A speculum was placed in the vagina and a single-tooth tenaculum was placed on the anterior cervical lip.  A paracervical block was performed with a total of 10 mL of 1% lidocaine.  The uterus was sounded to almost 8 cm.  The cervix was dilated to a #25 Pratt dilator.  A #7 suction tip curette was introduced to the level of the uterine fundus and proper suction was applied.  The suction tip was turned in a clockwise fashion as it was removed from within the uterine cavity.  This was repeated an additional time.  With a second  pass, there was hardly any tissue or clot obtained.  A sharp curettage was then gently performed with a sharp and a serrated curette.  This tissue was also sent to Pathology.  The tenaculum was removed from the anterior cervical lip.  Hemostasis was good.  The speculum was removed.  The patient was awakened and escorted to the recovery room in stable condition.  There were no complications to the procedure.  All needle, instrument, and sponge counts were correct.     Randye LoboBrook E. Silva, M.D.     BES/MEDQ  D:  08/13/2016  T:  08/13/2016  Job:  [id number]

## 2016-08-13 NOTE — Discharge Instructions (Signed)
Dilation and Curettage or Vacuum Curettage, Care After °This sheet gives you information about how to care for yourself after your procedure. Your health care provider may also give you more specific instructions. If you have problems or questions, contact your health care provider. °What can I expect after the procedure? °After your procedure, it is common to have: °· Mild pain or cramping. °· Some vaginal bleeding or spotting. °These may last for up to 2 weeks after your procedure. °Follow these instructions at home: °Activity  ° °· Do not drive or use heavy machinery while taking prescription pain medicine. °· Avoid driving for the first 24 hours after your procedure. °· Take frequent, short walks, followed by rest periods, throughout the day. Ask your health care provider what activities are safe for you. After 1-2 days, you may be able to return to your normal activities. °· Do not lift anything heavier than 10 lb (4.5 kg) until your health care provider approves. °· For at least 2 weeks, or as long as told by your health care provider, do not: °¨ Douche. °¨ Use tampons. °¨ Have sexual intercourse. °General instructions  ° °· Take over-the-counter and prescription medicines only as told by your health care provider. This is especially important if you take blood thinning medicine. °· Do not take baths, swim, or use a hot tub until your health care provider approves. Take showers instead of baths. °· Wear compression stockings as told by your health care provider. These stockings help to prevent blood clots and reduce swelling in your legs. °· It is your responsibility to get the results of your procedure. Ask your health care provider, or the department performing the procedure, when your results will be ready. °· Keep all follow-up visits as told by your health care provider. This is important. °Contact a health care provider if: °· You have severe cramps that get worse or that do not get better with  medicine. °· You have severe abdominal pain. °· You cannot drink fluids without vomiting. °· You develop pain in a different area of your pelvis. °· You have bad-smelling vaginal discharge. °· You have a rash. °Get help right away if: °· You have vaginal bleeding that soaks more than one sanitary pad in 1 hour, for 2 hours in a row. °· You pass large blood clots from your vagina. °· You have a fever that is above 100.4°F (38.0°C). °· Your abdomen feels very tender or hard. °· You have chest pain. °· You have shortness of breath. °· You cough up blood. °· You feel dizzy or light-headed. °· You faint. °· You have pain in your neck or shoulder area. °This information is not intended to replace advice given to you by your health care provider. Make sure you discuss any questions you have with your health care provider. °Document Released: 03/15/2000 Document Revised: 11/15/2015 Document Reviewed: 10/19/2015 °Elsevier Interactive Patient Education © 2017 [employer] ° ° ° °Post Anesthesia Home Care Instructions ° °Activity: °Get plenty of rest for the remainder of the day. A responsible individual must stay with you for 24 hours following the procedure.  °For the next 24 hours, DO NOT: °-Drive a car °-Operate machinery °-Drink alcoholic beverages °-Take any medication unless instructed by your physician °-Make any legal decisions or sign important papers. ° °Meals: °Start with liquid foods such as gelatin or soup. Progress to regular foods as tolerated. Avoid greasy, spicy, heavy foods. If nausea and/or vomiting occur, drink only clear liquids until   the nausea and/or vomiting subsides. Call your physician if vomiting continues. ° °Special Instructions/Symptoms: °Your throat may feel dry or sore from the anesthesia or the breathing tube placed in your throat during surgery. If this causes discomfort, gargle with warm salt water. The discomfort should disappear within 24 hours. ° °If you had a scopolamine patch placed  behind your ear for the management of post- operative nausea and/or vomiting: ° °1. The medication in the patch is effective for 72 hours, after which it should be removed.  Wrap patch in a tissue and discard in the trash. Wash hands thoroughly with soap and water. °2. You may remove the patch earlier than 72 hours if you experience unpleasant side effects which may include dry mouth, dizziness or visual disturbances. °3. Avoid touching the patch. Wash your hands with soap and water after contact with the patch. °  ° ° °

## 2016-08-13 NOTE — Progress Notes (Signed)
Update to History and Physical  Mild right lower quadrant discomfort.  Patient states she is not sure if this is constipation.  She has not had signs of an acute abdomen.  Her hCG is very low at 34.8, which is down from her max of 46.5 prior to the Methotrexate.  I am recommending the dilation and evacuation today.  She will have a hCG tomorrow am in the office.  She is aware she may need another dosage of the Methotrexate post surgery.

## 2016-08-14 ENCOUNTER — Telehealth: Payer: Self-pay | Admitting: Obstetrics and Gynecology

## 2016-08-14 ENCOUNTER — Other Ambulatory Visit: Payer: Self-pay

## 2016-08-14 ENCOUNTER — Encounter (HOSPITAL_COMMUNITY): Payer: Self-pay | Admitting: Obstetrics and Gynecology

## 2016-08-14 ENCOUNTER — Other Ambulatory Visit (INDEPENDENT_AMBULATORY_CARE_PROVIDER_SITE_OTHER): Payer: Self-pay

## 2016-08-14 DIAGNOSIS — O200 Threatened abortion: Secondary | ICD-10-CM

## 2016-08-14 NOTE — Telephone Encounter (Signed)
Spoke with patient. Advised patient results not back, will return call when resulted. RN checked to see how patient is feeling. Patient reports cramping, using heat for comfort until nausea resolves. Reports nausea, still has scopolamine patch. Patient states she [facility] to confirm that is ok take Zofran 4mg  for nausea with patch, was advised ok to take. Advised patient would update Dr. Edward JollySilva and return call with any additional recommendations, patient is agreeable.   Dr. Edward JollySilva -any additional recommendations?

## 2016-08-14 NOTE — Telephone Encounter (Signed)
Patient called to see if her lab results are ready from earlier today.

## 2016-08-14 NOTE — Telephone Encounter (Signed)
I agree with your recommendations.  I expect the results to be ready tomorrow.

## 2016-08-15 ENCOUNTER — Telehealth: Payer: Self-pay

## 2016-08-15 LAB — HCG, QUANTITATIVE, PREGNANCY: hCG, Beta Chain, Quant, S: 7.2 m[IU]/mL — ABNORMAL HIGH

## 2016-08-15 NOTE — Telephone Encounter (Signed)
Spoke with patient. Advised of message as seen below from Dr.Silva. Patient is agreeable and verbalizes understanding. Follow up scheduled for 08/21/2016 at 3 pm with Dr.Silva. Patient is agreeable to date and time.  Notes recorded by Patton SallesAmundson C Silva, Brook E, MD on 08/15/2016 at 9:24 AM EDT Please report hCG level of 7.2 Surgery looks like it was successful.  She needs an office visit with me and an hCG in 7 - 8 days.  Thank you.  ------  Notes recorded by Patton SallesAmundson C Silva, Brook E, MD on 08/15/2016 at 9:23 AM EDT Please report to the patient that her hCG is now 7.2.  The surgical pathology specimen is still being analyzed.  It looks like her dilation and evacuation was successful.  Please have her return to the office for a visit with me and repeat hCG in one week.  Routing to provider for final review. Patient agreeable to disposition. Will close encounter.

## 2016-08-21 ENCOUNTER — Encounter: Payer: Self-pay | Admitting: Obstetrics and Gynecology

## 2016-08-21 ENCOUNTER — Ambulatory Visit (INDEPENDENT_AMBULATORY_CARE_PROVIDER_SITE_OTHER): Payer: Medicaid Other | Admitting: Obstetrics and Gynecology

## 2016-08-21 ENCOUNTER — Ambulatory Visit (INDEPENDENT_AMBULATORY_CARE_PROVIDER_SITE_OTHER): Payer: Medicaid Other | Admitting: Family Medicine

## 2016-08-21 ENCOUNTER — Encounter: Payer: Self-pay | Admitting: Family Medicine

## 2016-08-21 VITALS — BP 121/82 | HR 79 | Temp 98.8°F | Resp 18 | Ht 67.44 in | Wt 189.6 lb

## 2016-08-21 VITALS — BP 110/60 | HR 72 | Resp 16 | Wt 188.0 lb

## 2016-08-21 DIAGNOSIS — M545 Low back pain: Secondary | ICD-10-CM

## 2016-08-21 DIAGNOSIS — G8929 Other chronic pain: Secondary | ICD-10-CM | POA: Diagnosis not present

## 2016-08-21 DIAGNOSIS — Z79899 Other long term (current) drug therapy: Secondary | ICD-10-CM

## 2016-08-21 DIAGNOSIS — R799 Abnormal finding of blood chemistry, unspecified: Secondary | ICD-10-CM

## 2016-08-21 DIAGNOSIS — F988 Other specified behavioral and emotional disorders with onset usually occurring in childhood and adolescence: Secondary | ICD-10-CM | POA: Diagnosis not present

## 2016-08-21 DIAGNOSIS — IMO0002 Reserved for concepts with insufficient information to code with codable children: Secondary | ICD-10-CM

## 2016-08-21 MED ORDER — AMPHETAMINE-DEXTROAMPHETAMINE 20 MG PO TABS: 20.0000 mg | ORAL_TABLET | Freq: Three times a day (TID) | ORAL | 0 refills | Status: DC

## 2016-08-21 MED ORDER — MELOXICAM 15 MG PO TABS: 15.0000 mg | ORAL_TABLET | Freq: Every day | ORAL | 5 refills | Status: DC

## 2016-08-21 MED ORDER — CYCLOBENZAPRINE HCL 10 MG PO TABS: 10.0000 mg | ORAL_TABLET | Freq: Three times a day (TID) | ORAL | 5 refills | Status: DC | PRN

## 2016-08-21 NOTE — Patient Instructions (Signed)
     IF you received an x-ray today, you will receive an invoice from [facility]. Please contact [facility] at [phone] with questions or concerns regarding your invoice.   IF you received labwork today, you will receive an invoice from [facility]. Please contact [facility] at [phone] with questions or concerns regarding your invoice.   Our billing staff will not be able to assist you with questions regarding bills from these companies.  You will be contacted with the lab results as soon as they are available. The fastest way to get your results is to activate your My Chart account. Instructions are located on the last page of this paperwork. If you have not heard from us regarding the results in 2 weeks, please contact this office.     
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
## 2016-08-21 NOTE — Progress Notes (Signed)
Subjective:  By signing my name below, I, [name], attest that this documentation has been prepared under the direction and in the presence of Norberto SorensonEva Shaw, MD Electronically Signed: Charline BillsEssence Howell, ED Scribe 08/21/2016 at 5:54 PM.   Patient ID: Regina KaplanEmily Ulbrich, female    DOB: June 16, 1980, 36 y.o.   MRN: [id number]  Chief Complaint  Patient presents with  . Medication Refill    Adderall  . Depression    X 2-3 weeks   HPI Regina Kaplanmily Whiteford is a 36 y.o. female who presents to [facility] Care at [facility] for a medication refill of Adderall. Pt was started on Adderall several years ago by Dr. Merla Richesoolittle. She is no longer in therapy. Pt was diagnosed with adult ADD around 2012. Pt states that she stopped Adderall ~3 weeks ago when she found out she was pregnancy, however, she was told she had an atopic pregnancy which was terminated with methotrexate. Pt states that she has not been able to concentration over the past 2-3 weeks and has been playing catch up at work since stopping the medication. She has also noticed some increased depression which she attributes to atopic pregnancy and difficulty sleeping due to back pain. Pt states that she has not been taking Flexeril or Meloxicam but has been using ibuprofen. She denies heartburn or indigestion. Pt was advised to stop exercising while taking methotrexate but she plans to start yoga and join the aquatic center once she is cleared.   Past Medical History:  Diagnosis Date  . Anemia   . Anxiety   . Depression   . Herniated disc, cervical   . Migraines    Current Outpatient Prescriptions on File Prior to Visit  Medication Sig Dispense Refill  . Psyllium (EQ DAILY FIBER PO) Take 1 capsule by mouth daily.    Marland Kitchen. ibuprofen (ADVIL,MOTRIN) 800 MG tablet Take 1 tablet (800 mg total) by mouth every 8 (eight) hours as needed. (Patient not taking: Reported on 08/21/2016) 30 tablet 0   No current facility-administered medications on file prior to visit.    No Known  Allergies   Review of Systems  Musculoskeletal: Positive for back pain.  Psychiatric/Behavioral: Positive for decreased concentration and dysphoric mood.      Objective:   Physical Exam  Constitutional: She is oriented to person, place, and time. She appears well-developed and well-nourished. No distress.  HENT:  Head: Normocephalic and atraumatic.  Eyes: Conjunctivae and EOM are normal.  Neck: Neck supple. No tracheal deviation present. No thyroid mass and no thyromegaly present.  Cardiovascular: Normal rate, regular rhythm, S1 normal, S2 normal and normal heart sounds.   Pulmonary/Chest: Effort normal and breath sounds normal. No respiratory distress.  Musculoskeletal: Normal range of motion.  Neurological: She is alert and oriented to person, place, and time.  Skin: Skin is warm and dry.  Psychiatric: She has a normal mood and affect. Her behavior is normal.  Nursing note and vitals reviewed.  BP 121/82 (BP Location: Right Arm, Patient Position: Sitting, Cuff Size: Small)   Pulse 79   Temp 98.8 F (37.1 C) (Oral)   Resp 18   Ht 5' 7.44" (1.713 m)   Wt 189 lb 9.6 oz (86 kg)   LMP 06/18/2016   SpO2 99%   BMI 29.31 kg/m     Assessment & Plan:   1. Chronic midline low back pain without sciatica   2. Attention deficit disorder (ADD) without hyperactivity   3. High risk medication use  Meds ordered this encounter  Medications  . amphetamine-dextroamphetamine (ADDERALL) 20 MG tablet    Sig: Take 1 tablet (20 mg total) by mouth 3 (three) times daily.    Dispense:  90 tablet    Refill:  0  . amphetamine-dextroamphetamine (ADDERALL) 20 MG tablet    Sig: Take 1 tablet (20 mg total) by mouth 3 (three) times daily.    Dispense:  90 tablet    Refill:  0    May fill 30d after date written  . amphetamine-dextroamphetamine (ADDERALL) 20 MG tablet    Sig: Take 1 tablet (20 mg total) by mouth 3 (three) times daily.    Dispense:  90 tablet    Refill:  0    May fill 60d  after date written  . DISCONTD: meloxicam (MOBIC) 15 MG tablet    Sig: Take 1 tablet (15 mg total) by mouth daily.    Dispense:  30 tablet    Refill:  5  . DISCONTD: cyclobenzaprine (FLEXERIL) 10 MG tablet    Sig: Take 1 tablet (10 mg total) by mouth 3 (three) times daily as needed for muscle spasms.    Dispense:  30 tablet    Refill:  5  . meloxicam (MOBIC) 15 MG tablet    Sig: Take 1 tablet (15 mg total) by mouth daily.    Dispense:  30 tablet    Refill:  5  . cyclobenzaprine (FLEXERIL) 10 MG tablet    Sig: Take 1 tablet (10 mg total) by mouth 3 (three) times daily as needed for muscle spasms.    Dispense:  30 tablet    Refill:  5    I personally performed the services described in this documentation, which was scribed in my presence. The recorded information has been reviewed and considered, and addended by me as needed.   Norberto Sorenson, M.D.  Primary Care at [facility] [address] [place], [place] [address] [phone] phone [phone] fax  09/03/16 3:08 AM

## 2016-08-22 ENCOUNTER — Telehealth: Payer: Self-pay | Admitting: Certified Nurse Midwife

## 2016-08-22 LAB — HCG, QUANTITATIVE, PREGNANCY

## 2016-08-22 NOTE — Telephone Encounter (Signed)
Spoke with patient. Patient verbalizes understanding and is agreeable. Patient request letter for insurance provider detailing date when pregnancy ended. Advised patient would review with Dr. Edward JollySilva and return call with recommendations, patient is agreeable.  Dr. Edward JollySilva, please advise?  Cc: Harland DingwallSuzy Dixon

## 2016-08-22 NOTE — Telephone Encounter (Signed)
Patient is calling to see if her results are in yet. °

## 2016-08-22 NOTE — Telephone Encounter (Signed)
Dr. Edward JollySilva, please review and advise on hCG quant labs dated 08/21/16.  08/21/16  HCG quant <2.0

## 2016-08-22 NOTE — Telephone Encounter (Signed)
Returned call to patient, mailbox full, unable to leave message. 

## 2016-08-22 NOTE — Telephone Encounter (Signed)
Please inform patient that her hCG is negative.  She is officially not pregnant.  She may start with her menses in 4 - 6 weeks. Avoid pregnancy for 3 months. Ok to resume PNV.  Cc- Sara Chuebbie Leonard

## 2016-08-23 NOTE — Telephone Encounter (Signed)
Please write a letter for the patient with the following dates I have taken from her chart.  LMP 06/18/16.  First visit on 07/26/16 with positive pregnancy test.  Pregnancy ended on 08/22/16.   Let me know if any additional information is needed.

## 2016-08-23 NOTE — Telephone Encounter (Signed)
Spoke with patient. Patient request letter to be addressed to "To Whom it May Concern" and mailed directly to her. Verified mailing address on file. Advised patient RN would prepare letter and mail out next week, advised our office is closed on Monday. Patient thankful for f/u and verbalizes understanding.

## 2016-08-27 NOTE — Telephone Encounter (Signed)
Dr. Edward JollySilva, letter placed on your desk for review and signature.

## 2016-08-28 NOTE — Telephone Encounter (Signed)
Letter placed in outgoing mail, will close encounter.

## 2016-08-29 ENCOUNTER — Telehealth: Payer: Self-pay | Admitting: Certified Nurse Midwife

## 2016-08-29 NOTE — Telephone Encounter (Signed)
Spoke with patient. Patient reports a hard, pea sized lump on right labia that appeared within last 2 days. Patient states unable to visualize lump, denies drainage. Patient reports as uncomfortable, not painful. Patient is concerned that this may be related to methotrexate given for ectopic pregnancy. Recommended OV for further evaluation, patient scheduled for 08/30/16 at 10:15am with Dr. Edward JollySilva. Advised patient would review with Dr. Edward JollySilva and return call with any additional recommendations, patient is agreeable.  Routing to provider for final review. Patient is agreeable to disposition. Will close encounter.

## 2016-08-29 NOTE — Telephone Encounter (Signed)
Patient wants to speak with the nurse. She states she is having a problem that may be related to ectopic pregnancy.

## 2016-08-30 ENCOUNTER — Encounter: Payer: Self-pay | Admitting: Obstetrics and Gynecology

## 2016-08-30 ENCOUNTER — Ambulatory Visit (INDEPENDENT_AMBULATORY_CARE_PROVIDER_SITE_OTHER): Payer: Self-pay | Admitting: Obstetrics and Gynecology

## 2016-08-30 VITALS — BP 100/62 | HR 88 | Resp 16 | Wt 189.0 lb

## 2016-08-30 DIAGNOSIS — N907 Vulvar cyst: Secondary | ICD-10-CM | POA: Diagnosis not present

## 2016-08-30 NOTE — Patient Instructions (Signed)
Call if the vulvar cyst increases in size, becomes painful, or if you have a fever.
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
## 2016-11-22 ENCOUNTER — Telehealth: Payer: Self-pay | Admitting: Family Medicine

## 2016-11-22 NOTE — Telephone Encounter (Signed)
Pt is re questing a refill on adderall .  Please adv.

## 2016-11-25 NOTE — Telephone Encounter (Signed)
Please advise 

## 2016-11-26 ENCOUNTER — Telehealth: Payer: Self-pay | Admitting: Family Medicine

## 2016-11-26 MED ORDER — AMPHETAMINE-DEXTROAMPHETAMINE 20 MG PO TABS: 20.0000 mg | ORAL_TABLET | Freq: Three times a day (TID) | ORAL | 0 refills | Status: DC

## 2016-11-26 NOTE — Telephone Encounter (Signed)
Attempted to call patient but would not allow me to leave a message. Patient can pick up rx anytime tomorrow 11/27/16 or after at 102

## 2016-11-26 NOTE — Telephone Encounter (Signed)
Adv pt of note below.

## 2016-11-26 NOTE — Addendum Note (Signed)
Addended by: Sherren Mocha on: 11/26/2016 04:23 PM   Modules accepted: Orders

## 2016-11-26 NOTE — Telephone Encounter (Signed)
Can pick up anytime Wed 8/29 or after at 102

## 2016-11-28 ENCOUNTER — Telehealth: Payer: Self-pay | Admitting: Family Medicine

## 2016-11-28 NOTE — Telephone Encounter (Signed)
Pt is needing to get her adderall rx -she has been told since 11/27/16 that it is ready to pick up but nothing is to be found   Best number [phone]

## 2016-11-29 ENCOUNTER — Ambulatory Visit: Payer: PRIVATE HEALTH INSURANCE | Admitting: Certified Nurse Midwife

## 2016-11-29 MED ORDER — AMPHETAMINE-DEXTROAMPHETAMINE 20 MG PO TABS: 20.0000 mg | ORAL_TABLET | Freq: Three times a day (TID) | ORAL | 0 refills | Status: DC

## 2016-11-29 NOTE — Telephone Encounter (Signed)
Dr. Clelia CroftShaw I do not know where this rx is. I called upstairs and even looked for it downstairs and it is nowhere. Would you mind reordering it please ?

## 2016-11-29 NOTE — Telephone Encounter (Signed)
I signed them and put them in the box "rxs from providers" at 102 in the front office- the clear one above the fax - put them there on Wed 8/29 a.m.

## 2016-11-29 NOTE — Telephone Encounter (Signed)
I called the patient and told her it was ready for pick up at 102 pick-up drawer.

## 2016-12-12 ENCOUNTER — Ambulatory Visit (INDEPENDENT_AMBULATORY_CARE_PROVIDER_SITE_OTHER): Payer: Self-pay | Admitting: Obstetrics and Gynecology

## 2016-12-12 ENCOUNTER — Encounter: Payer: Self-pay | Admitting: Obstetrics and Gynecology

## 2016-12-12 ENCOUNTER — Telehealth: Payer: Self-pay | Admitting: *Deleted

## 2016-12-12 ENCOUNTER — Telehealth: Payer: Self-pay | Admitting: Obstetrics and Gynecology

## 2016-12-12 VITALS — BP 110/78 | HR 76 | Resp 16 | Wt 189.0 lb

## 2016-12-12 DIAGNOSIS — N926 Irregular menstruation, unspecified: Secondary | ICD-10-CM

## 2016-12-12 LAB — BETA HCG QUANT (REF LAB)

## 2016-12-12 LAB — POCT URINE PREGNANCY: PREG TEST UR: NEGATIVE

## 2016-12-12 NOTE — Telephone Encounter (Signed)
Spoke with patient. Patient states that her menses is 5 days late. Has taken UPT that was negative. Is having breast tenderness and cravings. LMP 11/02/2016-11/05/2016. Denies any pain. Patient is concerned as she had to have a D&E earlier this year 08/13/2016 due to abnormal human chorionic gonadotropin. Concerned for ectopic pregnancy. Advised will need to be seen for further evaluation. Patient is agreeable. Appointment scheduled for 12/12/2016 at 2pm with Dr.Jertson as Dr.Silva is out of the office. Patient is agreeable to date and time. Declines earlier appointment.  Routing to covering provider for final review. Patient agreeable to disposition. Will close encounter.

## 2016-12-12 NOTE — Telephone Encounter (Signed)
Patient called with concerns she is 5 days late for her menstrual cycle. She's like to speak with the nurse because she said she's had a ectopic pregnancy before and is concerned it may happen again.

## 2016-12-12 NOTE — Telephone Encounter (Signed)
Took [facility] call from [facility] with hCG, Beta results : <1. Gave verbal report to Dr. Oscar LaJertson.  Called and informed patient that lab showed negative. Patient voiced understanding
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
## 2016-12-13 LAB — PROGESTERONE: PROGESTERONE: 0.9 ng/mL

## 2016-12-17 LAB — TSH: TSH: 3.02 u[IU]/mL (ref 0.450–4.500)

## 2016-12-17 LAB — SPECIMEN STATUS REPORT

## 2016-12-17 LAB — PROLACTIN: Prolactin: 12 ng/mL (ref 4.8–23.3)

## 2017-03-08 ENCOUNTER — Ambulatory Visit: Payer: Self-pay | Admitting: Family Medicine

## 2017-03-08 ENCOUNTER — Encounter: Payer: Self-pay | Admitting: Family Medicine

## 2017-03-08 ENCOUNTER — Other Ambulatory Visit: Payer: Self-pay

## 2017-03-08 ENCOUNTER — Ambulatory Visit (INDEPENDENT_AMBULATORY_CARE_PROVIDER_SITE_OTHER): Payer: Self-pay

## 2017-03-08 VITALS — BP 116/78 | HR 102 | Temp 98.1°F | Resp 16 | Ht 67.32 in | Wt 190.0 lb

## 2017-03-08 DIAGNOSIS — Z3200 Encounter for pregnancy test, result unknown: Secondary | ICD-10-CM

## 2017-03-08 DIAGNOSIS — M5431 Sciatica, right side: Secondary | ICD-10-CM

## 2017-03-08 DIAGNOSIS — M5416 Radiculopathy, lumbar region: Secondary | ICD-10-CM

## 2017-03-08 DIAGNOSIS — M5116 Intervertebral disc disorders with radiculopathy, lumbar region: Secondary | ICD-10-CM

## 2017-03-08 DIAGNOSIS — F988 Other specified behavioral and emotional disorders with onset usually occurring in childhood and adolescence: Secondary | ICD-10-CM

## 2017-03-08 LAB — POCT URINE PREGNANCY: Preg Test, Ur: NEGATIVE

## 2017-03-08 MED ORDER — IBUPROFEN 800 MG PO TABS
800.0000 mg | ORAL_TABLET | Freq: Three times a day (TID) | ORAL | 1 refills | Status: DC | PRN
Start: 2017-03-08 — End: 2017-06-23

## 2017-03-08 MED ORDER — PREDNISONE 20 MG PO TABS: ORAL_TABLET | ORAL | 0 refills | Status: DC

## 2017-03-08 MED ORDER — CYCLOBENZAPRINE HCL 10 MG PO TABS
10.0000 mg | ORAL_TABLET | Freq: Three times a day (TID) | ORAL | 5 refills | Status: DC | PRN
Start: 2017-03-08 — End: 2017-07-23

## 2017-03-08 MED ORDER — AMPHETAMINE-DEXTROAMPHETAMINE 20 MG PO TABS: 20.0000 mg | ORAL_TABLET | Freq: Three times a day (TID) | ORAL | 0 refills | Status: DC

## 2017-03-08 NOTE — Progress Notes (Signed)
Subjective:  By signing my name below, I, [name], attest that this documentation has been prepared under the direction and in the presence of Norberto Sorenson, MD Electronically Signed: Charline Bills, ED Scribe 03/08/2017 at 11:16 AM.   Patient ID: Regina Tyler, female    DOB: 07/01/1980, 36 y.o.   MRN: [id number]  Chief Complaint  Patient presents with  . Back Pain    lower back x 1 week, pt states she had surgey on her back about 1 year ago from an injury and thinks thats were the pain may be coming from.    HPI Regina Tyler is a 36 y.o. female who presents to Primary Care at [facility] complaining of waxing and waning low back pain x 1 week. H/o R L5-S1 lumbar laminectomy for spinal cord decompression by Dr. Raynald Kemp at [facility] through [facility] in [place] done 07/04/15. Pt also has chronic L4/5 bulging disc to the R. Pt states that she has had burning low back pain since her surgery 1 year ago but it has gradually worsened over the past week. She reports low back pain that radiates down her R leg and into her R knee. Pain is exacerbated with walking on concrete and driving. She reports associated intermittent numbness in both feet, R worse than L, and mild saddle anaesthesia a few days ago but none at this time. She has also noticed some weakness in her R leg and reports that she has been leading with her L leg up stairs. Pain is improved with yoga poses and massaging the area. She has been taking Meloxicam daily and Flexeril qhs. Denies changes in bowels/bladder (reports chronic constipation).   Past Medical History:  Diagnosis Date  . Anemia   . Anxiety   . Depression   . Herniated disc, cervical   . Migraines    Current Outpatient Medications on File Prior to Visit  Medication Sig Dispense Refill  . amphetamine-dextroamphetamine (ADDERALL) 20 MG tablet Take 1 tablet (20 mg total) by mouth 3 (three) times daily. 90 tablet 0  . cyclobenzaprine (FLEXERIL) 10 MG tablet Take 1 tablet (10 mg  total) by mouth 3 (three) times daily as needed for muscle spasms. 30 tablet 5  . folic acid (FOLVITE) 400 MCG tablet Take by mouth daily.    . meloxicam (MOBIC) 15 MG tablet Take 1 tablet (15 mg total) by mouth daily. 30 tablet 5   No current facility-administered medications on file prior to visit.    No Known Allergies  Past Surgical History:  Procedure Laterality Date  . ANKLE SURGERY Left 2007   with ORIF and screws remain  . APPENDECTOMY  2003  . DILATION AND EVACUATION N/A 08/13/2016   Procedure: DILATATION AND EVACUATION;  Surgeon: Patton Salles, MD;  Location: [facility];  Service: Gynecology;  Laterality: N/A;  . LUMBAR LAMINECTOMY  07/04/2015   [facility]  . paragard insertion  2011   removed 12/02/14  . SPINE SURGERY  2007/2008   cervical injections   Family History  Problem Relation Age of Onset  . Breast cancer Mother 80  . Osteoporosis Mother   . Cancer Maternal Grandmother        uterine  . Cancer Maternal Grandfather        blood  . Hypertension Paternal Grandmother   . Parkinson's disease Paternal Grandfather   . Mitral valve prolapse Father    Social History   Socioeconomic History  . Marital status: Married  Spouse name: [place]  . Number of children: 0  . Years of education: College  . Highest education level: None  Social Needs  . Financial resource strain: None  . Food insecurity - worry: None  . Food insecurity - inability: None  . Transportation needs - medical: None  . Transportation needs - non-medical: None  Occupational History  . Occupation: Technical sales engineermusician  . Occupation: yoga    Employer: [employer]  Tobacco Use  . Smoking status: Never Smoker  . Smokeless tobacco: Never Used  Substance and Sexual Activity  . Alcohol use: No  . Drug use: No  . Sexual activity: Yes    Partners: Male  Other Topics Concern  . None  Social History Narrative   Patient lives at home with spouse.   Caffeine use: 2-3 cups daily    [facility] 2[facility] 03/08/2017 08/21/2016 03/11/2016 02/19/2016 09/16/2015  Decreased Interest 0 1 0 0 0  Down, Depressed, Hopeless 0 1 0 0 0  PHQ - 2 Score 0 2 0 0 0  Altered sleeping - 0 - - -  Tired, decreased energy - 0 - - -  Change in appetite - 0 - - -  Feeling bad or failure about yourself  - 0 - - -  Trouble concentrating - 3 - - -  Moving slowly or fidgety/restless - 1 - - -  Suicidal thoughts - 0 - - -  PHQ-9 Score - 6 - - -  Difficult doing work/chores - Somewhat difficult - - -    Review of Systems  Gastrointestinal: Negative for blood in stool and diarrhea. Constipation: chronic.  Genitourinary: Negative for decreased urine volume, difficulty urinating, enuresis and hematuria.  Musculoskeletal: Positive for back pain.  Neurological: Positive for weakness (R leg) and numbness (intermittent in feet).      Objective:   Physical Exam  Constitutional: She is oriented to person, place, and time. She appears well-developed and well-nourished. No distress.  HENT:  Head: Normocephalic and atraumatic.  Eyes: Conjunctivae and EOM are normal.  Neck: Neck supple. No tracheal deviation present.  Cardiovascular: Normal rate.  Pulmonary/Chest: Effort normal. No respiratory distress.  Musculoskeletal: Normal range of motion.  Tenderness to palpation over L5, prior surgical site. Diffuse tenderness where lumbar paraspinal muscles with spasms. Tenderness to palpation over bilateral SI joints. Good R hip ROM. Positive straight leg raise and pain with hip flexor on the R. Negative straight leg raise on the L. Very slight weakness in the R hip flexor hamstring, quad/throughout.   Neurological: She is alert and oriented to person, place, and time.  Skin: Skin is warm and dry.  Psychiatric: She has a normal mood and affect. Her behavior is normal.  Vitals reviewed.  BP 116/78   Pulse (!) 102   Temp 98.1 F (36.7 C) (Oral)   Resp 16   Ht 5' 7.32" (1.71 m)   Wt 190 lb (86.2 kg)    LMP 02/14/2017   SpO2 100%   BMI 29.47 kg/m     Results for orders placed or performed in visit on 03/08/17  POCT urine pregnancy  Result Value Ref Range   Preg Test, Ur Negative Negative   Dg Lumbar Spine 2-3 Views  Result Date: 03/08/2017 CLINICAL DATA:  36 year old female with worsening right lower back pain and sciatica. EXAM: LUMBAR SPINE - 2-3 VIEW COMPARISON:  04/03/2012 FINDINGS: There is no evidence of lumbar spine fracture. Alignment is normal. Intervertebral disc spaces are maintained. There are  mild discogenic degenerative changes, particularly at the lower lumbar spine with facet arthropathy at L5-S1. IMPRESSION: No acute osseous abnormality. Mild degenerative changes of the lower lumbar spine. Electronically Signed   By: Sande BrothersSerena  Chacko M.D.   On: 03/08/2017 12:11   Assessment & Plan:   1. Lumbar radiculopathy   2. Lumbar disc herniation with radiculopathy   3. Back pain with right-sided sciatica   4. Encounter for pregnancy test, result unknown   5. Attention deficit disorder (ADD) without hyperactivity     Orders Placed This Encounter  Procedures  . DG Lumbar Spine 2-3 Views    Standing Status:   Future    Number of Occurrences:   1    Standing Expiration Date:   03/08/2018    Order Specific Question:   Reason for Exam (SYMPTOM  OR DIAGNOSIS REQUIRED)    Answer:   worsening right low back pain and sciatica, h/o bulging discs L4-5, L5-1 wiht right laminectomy L5-S1 07/2015    Order Specific Question:   Is the patient pregnant?    Answer:   No    Order Specific Question:   Preferred imaging location?    Answer:   External  . POCT urine pregnancy    Meds ordered this encounter  Medications  . predniSONE (DELTASONE) 20 MG tablet    Sig: 4 tabs po x qd x 1d, then 3 tabs po qd x 2d, then 2 tabs po qd x 3d, then 1 tab po qd x 3d    Dispense:  19 tablet    Refill:  0  . ibuprofen (ADVIL,MOTRIN) 800 MG tablet    Sig: Take 1 tablet (800 mg total) by mouth every 8  (eight) hours as needed. AFTER prednisone is complete    Dispense:  90 tablet    Refill:  1  . cyclobenzaprine (FLEXERIL) 10 MG tablet    Sig: Take 1 tablet (10 mg total) by mouth 3 (three) times daily as needed for muscle spasms.    Dispense:  30 tablet    Refill:  5  . DISCONTD: amphetamine-dextroamphetamine (ADDERALL) 20 MG tablet    Sig: Take 1 tablet (20 mg total) by mouth 3 (three) times daily.    Dispense:  90 tablet    Refill:  0  . DISCONTD: amphetamine-dextroamphetamine (ADDERALL) 20 MG tablet    Sig: Take 1 tablet (20 mg total) by mouth 3 (three) times daily.    Dispense:  90 tablet    Refill:  0  . amphetamine-dextroamphetamine (ADDERALL) 20 MG tablet    Sig: Take 1 tablet (20 mg total) by mouth 3 (three) times daily.    Dispense:  90 tablet    Refill:  0    I personally performed the services described in this documentation, which was scribed in my presence. The recorded information has been reviewed and considered, and addended by me as needed.   Norberto SorensonEva Sharilyn Geisinger, M.D.  [facility]  [place] [address] [place], [place] [id number] [phone] phone [phone] fax  03/11/17 8:58 AM

## 2017-03-08 NOTE — Patient Instructions (Addendum)
IF you received an x-ray today, you will receive an invoice from [facility] Rad[facility]. Please contact [facility] at [phone] with questions or concerns regarding your invoice.   IF you received labwork today, you will receive an invoice from [place]. Please contact [facility] at [phone] with questions or concerns regarding your invoice.   Our billing staff will not be able to assist you with questions regarding bills from these companies.  You will be contacted with the lab results as soon as they are available. The fastest way to get your results is to activate your My Chart account. Instructions are located on the last page of this paperwork. If you have not heard from [place] regarding the results in 2 weeks, please contact this office.      Piriformis Syndrome Rehab Ask your health care provider which exercises are safe for you. Do exercises exactly as told by your health care provider and adjust them as directed. It is normal to feel mild stretching, pulling, tightness, or discomfort as you do these exercises, but you should stop right away if you feel sudden pain or your pain gets worse.Do not begin these exercises until told by your health care provider. Stretching and range of motion exercises These exercises warm up your muscles and joints and improve the movement and flexibility of your hip and pelvis. These exercises also help to relieve pain, numbness, and tingling. Exercise A: Hip rotators  1. Lie on your back on a firm surface. 2. Pull your left / right knee toward your same shoulder with your left / right hand until your knee is pointing toward the ceiling. Hold your left / right ankle with your other hand. 3. Keeping your knee steady, gently pull your left / right ankle toward your other shoulder until you feel a stretch in your buttocks. 4. Hold this position for __________ seconds. Repeat __________ times. Complete this stretch __________ times a  day. Exercise B: Hip extensors 1. Lie on your back on a firm surface. Both of your legs should be straight. 2. Pull your left / right knee to your chest. Hold your leg in this position by holding onto the back of your thigh or the front of your knee. 3. Hold this position for __________ seconds. 4. Slowly return to the starting position. Repeat __________ times. Complete this stretch __________ times a day. Strengthening exercises These exercises build strength and endurance in your hip and thigh muscles. Endurance is the ability to use your muscles for a long time, even after they get tired. Exercise C: Straight leg raises ( hip abductors) 1. Lie on your side with your left / right leg in the top position. Lie so your head, shoulder, knee, and hip line up. Bend your bottom knee to help you balance. 2. Lift your top leg up 4-6 inches (10-15 cm), keeping your toes pointed straight ahead. 3. Hold this position for __________ seconds. 4. Slowly lower your leg to the starting position. Let your muscles relax completely. Repeat __________ times. Complete this exercise__________ times a day. Exercise D: Hip abductors and rotators, quadruped  1. Get on your hands and knees on a firm, lightly padded surface. Your hands should be directly below your shoulders, and your knees should be directly below your hips. 2. Lift your left / right knee out to the side. Keep your knee bent. Do not twist your body. 3. Hold this position for __________ seconds. 4. Slowly lower your leg. Repeat __________ times. Complete  this exercise__________ times a day. Exercise E: Straight leg raises ( hip extensors) 1. Lie on your abdomen on a bed or a firm surface with a pillow under your hips. 2. Squeeze your buttock muscles and lift your left / right thigh off the bed. Do not let your back arch. 3. Hold this position for __________ seconds. 4. Slowly return to the starting position. Let your muscles relax completely  before doing another repetition. Repeat __________ times. Complete this exercise__________ times a day. This information is not intended to replace advice given to you by your health care provider. Make sure you discuss any questions you have with your health care provider. Document Released: 03/18/2005 Document Revised: 11/21/2015 Document Reviewed: 02/28/2015 Elsevier Interactive Patient Education  2018 [employer]  Piriformis Syndrome Piriformis syndrome is a condition that can cause pain and numbness in your buttocks and down the back of your leg. Piriformis syndrome happens when the small muscle that connects the base of your spine to your hip (piriformis muscle) presses on the nerve that runs down the back of your leg (sciatic nerve). The piriformis muscle helps your hip rotate and helps to bring your leg back and out. It also helps shift your weight while you are walking to keep you stable. The sciatic nerve runs under or through the piriformis. Damage to the piriformis muscle can cause spasms that put pressure on the nerve below. This causes pain and discomfort while sitting and moving. The pain may feel as if it begins in the buttock and spreads (radiates) down your hip and thigh. What are the causes? This condition is caused by pressure on the sciatic nerve from the piriformis muscle. The piriformis muscle can get irritated with overuse, especially if other hip muscles are weak and the piriformis has to do extra work. Piriformis syndrome can also occur after an injury, like a fall onto your buttocks. What increases the risk? This condition is more likely to develop in:  Women.  People who sit for long periods of time.  Cyclists.  People who have weak buttocks muscles (gluteal muscles).  What are the signs or symptoms? Pain, tingling, or numbness that starts in the buttock and runs down the back of your leg (sciatica) is the most common symptom of this condition. Your symptoms  may:  Get worse the longer you sit.  Get worse when you walk, run, or go up on stairs.  How is this diagnosed? This condition is diagnosed based on your symptoms, medical history, and physical exam. During this exam, your health care provider may move your leg into different positions to check for pain. He or she will also press on the muscles of your hip and buttock to see if that increases your symptoms. You may also have an X-ray or MRI. How is this treated? Treatment for this condition may include:  Stopping all activities that cause pain or make your condition worse.  Using heat or ice to relieve pain as told by your health care provider.  Taking medicines to reduce pain and swelling.  Taking a muscle relaxer to release the piriformis muscle.  Doing range-of-motion and strengthening exercises (physical therapy) as told by your health care provider.  Massaging the affected area.  Getting an injection of an anti-inflammatory medicine or muscle relaxer to reduce inflammation and muscle tension.  In rare cases, you may need surgery to cut the muscle and release pressure on the nerve if other treatments do not work. Follow these instructions at  home:  Take over-the-counter and prescription medicines only as told by your health care provider.  Do not sit for long periods. Get up and walk around every 20 minutes or as often as told by your health care provider.  If directed, apply heat to the affected area as often as told by your health care provider. Use the heat source that your health care provider recommends, such as a moist heat pack or a heating pad. ? Place a towel between your skin and the heat source. ? Leave the heat on for 20-30 minutes. ? Remove the heat if your skin turns bright red. This is especially important if you are unable to feel pain, heat, or cold. You may have a greater risk of getting burned.  If directed, apply ice to the injured area. ? Put ice in a  plastic bag. ? Place a towel between your skin and the bag. ? Leave the ice on for 20 minutes, 2-3 times a day.  Do exercises as told by your health care provider.  Return to your normal activities as told by your health care provider. Ask your health care provider what activities are safe for you.  Keep all follow-up visits as told by your health care provider. This is important. How is this prevented?  Do not sit for longer than 20 minutes at a time. When you sit, choose padded surfaces.  Warm up and stretch before being active.  Cool down and stretch after being active.  Give your body time to rest between periods of activity.  Make sure to use equipment that fits you.  Maintain physical fitness, including: ? Strength. ? Flexibility. Contact a health care provider if:  Your pain and stiffness continue or get worse.  Your leg or hip becomes weak.  You have changes in your bowel function or bladder function. This information is not intended to replace advice given to you by your health care provider. Make sure you discuss any questions you have with your health care provider. Document Released: 03/18/2005 Document Revised: 11/21/2015 Document Reviewed: 02/28/2015 Elsevier Interactive Patient Education  [employer]2018 [employer] Inc.

## 2017-03-31 ENCOUNTER — Ambulatory Visit: Payer: Self-pay | Admitting: Family Medicine

## 2017-06-23 ENCOUNTER — Ambulatory Visit: Payer: BLUE CROSS/BLUE SHIELD | Admitting: Family Medicine

## 2017-06-23 ENCOUNTER — Encounter: Payer: Self-pay | Admitting: Family Medicine

## 2017-06-23 ENCOUNTER — Other Ambulatory Visit: Payer: Self-pay

## 2017-06-23 VITALS — BP 110/88 | HR 111 | Temp 98.8°F | Resp 18 | Ht 67.32 in | Wt 193.8 lb

## 2017-06-23 DIAGNOSIS — Z79899 Other long term (current) drug therapy: Secondary | ICD-10-CM | POA: Diagnosis not present

## 2017-06-23 DIAGNOSIS — M5126 Other intervertebral disc displacement, lumbar region: Secondary | ICD-10-CM

## 2017-06-23 DIAGNOSIS — F988 Other specified behavioral and emotional disorders with onset usually occurring in childhood and adolescence: Secondary | ICD-10-CM | POA: Diagnosis not present

## 2017-06-23 MED ORDER — MELOXICAM 15 MG PO TABS: 15.0000 mg | ORAL_TABLET | Freq: Every day | ORAL | 0 refills | Status: DC

## 2017-06-23 MED ORDER — AMPHETAMINE-DEXTROAMPHETAMINE 20 MG PO TABS: 20.0000 mg | ORAL_TABLET | Freq: Three times a day (TID) | ORAL | 0 refills | Status: DC

## 2017-06-23 MED ORDER — MELOXICAM 15 MG PO TABS: 15.0000 mg | ORAL_TABLET | Freq: Every day | ORAL | 1 refills | Status: DC | PRN
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
## 2017-06-23 NOTE — Patient Instructions (Addendum)
If symptoms of a sinus infection recur within the next 2 weeks, call and I will call you in a course of antibiotics.  If you are finding the meloxicam ineffective and want to try a different NSAID (diclofenac, nabumetone/relafen, etodolac are some examples), I am happy to call in a trial. Remember to never combine any other otc pain medication/NSAID other than tylenol/acetaminophen - so no aleve, ibuprofen, motrin, advil, BC, Goody's, Excedrin, etc.  However, it is shown that taking 2 extra-strength tylenol/acetaminophen (to equal 1000mg  dose) ALONG WITH a NSAID (listed above) is as potent pain relief as what is achieved with narcotics.  Ok to call in 3 mos for 3 additional mos of adderall refills as long as you are doing well. Then recheck in 6 mos for any additional refills. Your current adderall rxs on file with the pharmacy can be filled on or after Today Mon 3/25 [facility] 4/22 Mon 5/20     IF you received an x-ray today, you will receive an invoice from [facility]. Please contact [facility] at [phone] with questions or concerns regarding your invoice.   IF you received labwork today, you will receive an invoice from [place]. Please contact [facility] at [phone] with questions or concerns regarding your invoice.   Our billing staff will not be able to assist you with questions regarding bills from these companies.  You will be contacted with the lab results as soon as they are available. The fastest way to get your results is to activate your My Chart account. Instructions are located on the last page of this paperwork. If you have not heard from [place] regarding the results in 2 weeks, please contact this office.     What You Need to Know About Chronic Back Pain Long-term (chronic) back pain is back pain that lasts for 12 weeks or longer. It often affects the lower back and can range from mild to severe. Many people have back pain at some point in their lives. It  can feel different to each person. It may feel like a muscle ache or a sharp, stabbing pain. The pain often gets worse over time. It can be difficult to find the cause of chronic back pain. Treating chronic back pain often starts with rest and pain relief, followed by exercises (physical therapy) to strengthen the muscles that support your back. You may have to try different things to see what works best for you. If other treatments do not help, or if your pain is caused by a condition or an injury, you may need surgery. How can back pain affect me? Chronic back pain is uncomfortable and can make it hard to do your usual daily activities. Chronic back pain can:  Cause numbness and tingling.  Come and go.  Get worse when you are sitting, standing, walking, bending, or lifting.  Affect you while you are active, at rest, or both.  Eventually make it hard to move around.  Occur with fever, weight loss, or difficulty urinating.  What are the benefits of treating back pain? Treating chronic back pain may:  Relieve pain.  Keep your pain from getting worse.  Make it easier for you to do your usual activities.  What are some steps I can take to decrease my back pain?  Take over-the-counter or prescription medicines only as told by your health care provider.  If directed, apply heat to the affected area. Use the heat source that your health care provider recommends, such as a moist  heat pack or a heating pad. ? Place a towel between your skin and the heat source. ? Leave the heat on for 20-30 minutes. ? Remove the heat if your skin turns bright red. This is especially important if you are unable to feel pain, heat, or cold. You may have a greater risk of getting burned.  If directed, put ice on the affected area: ? Put ice in a plastic bag. ? Place a towel between your skin and the bag. ? Leave the ice on for 20 minutes, 2-3 times a day.  Get regular exercise as told by your health care  provider to improve flexibility and strength.  Do not smoke.  Maintain a healthy weight.  When lifting objects: ? Keep your feet as far apart as your shoulders (shoulder-width apart) or farther apart. ? Tighten the muscles in your abdomen. ? Bend your knees and hips and keep your spine neutral. It is important to lift using the strength of your legs, not your back. Do not lock your knees straight out. ? Always ask for help to lift heavy or awkward objects. What can happen if my back pain goes untreated? Untreated back pain can:  Get worse over time.  Start to occur more often or at different times, such as when you are resting.  Cause posture problems.  Make it hard to move around (limit mobility).  Where can I get support? Chronic back pain can be a frustrating condition to manage. It may help to talk with other people who are having a similar experience. Consider joining a support group for people dealing with chronic back pain. Ask your health care provider about support groups in your area. You can also find online and in-person support groups through:  The American Chronic Pain Association: [employer]fihttps://theacpa.org/Support-Groups  The U.S. Pain Foundation: uspainfoundation.org/support-groups  Contact a health care provider if:  Your symptoms do not get better or they get worse.  You have severe back pain.  You have chronic back pain and a fever.  You lose weight without trying.  You have difficulty urinating.  You experience numbness or tingling.  You develop new pain after an injury. Summary  Chronic back pain is often treated with rest, pain relief, and physical therapy.  Get regular exercise to improve your strength and flexibility.  Put heat and ice on the affected areas as directed by your health care provider.  Chronic back pain can be challenging to live with. Joining a support group may help you manage your condition. This information is not intended to  replace advice given to you by your health care provider. Make sure you discuss any questions you have with your health care provider. Document Released: 04/02/2015 Document Revised: 11/25/2015 Document Reviewed: 11/25/2015 Elsevier Interactive Patient Education  [employer]2018 [employer].

## 2017-06-24 LAB — COMPREHENSIVE METABOLIC PANEL
A/G RATIO: 1.8 (ref 1.2–2.2)
ALT: 10 IU/L (ref 0–32)
AST: 12 IU/L (ref 0–40)
Albumin: 4.6 g/dL (ref 3.5–5.5)
Alkaline Phosphatase: 58 IU/L (ref 39–117)
BILIRUBIN TOTAL: 0.4 mg/dL (ref 0.0–1.2)
BUN/Creatinine Ratio: 17 (ref 9–23)
BUN: 12 mg/dL (ref 6–20)
CHLORIDE: 101 mmol/L (ref 96–106)
CO2: 21 mmol/L (ref 20–29)
Calcium: 9.3 mg/dL (ref 8.7–10.2)
Creatinine, Ser: 0.69 mg/dL (ref 0.57–1.00)
GFR calc Af Amer: 130 mL/min/{1.73_m2} (ref >59)
GFR calc non Af Amer: 113 mL/min/{1.73_m2} (ref >59)
GLUCOSE: 108 mg/dL — AB (ref 65–99)
Globulin, Total: 2.6 g/dL (ref 1.5–4.5)
POTASSIUM: 4.6 mmol/L (ref 3.5–5.2)
Sodium: 139 mmol/L (ref 134–144)
Total Protein: 7.2 g/dL (ref 6.0–8.5)

## 2017-06-27 LAB — TOXASSURE SELECT 13 (MW), URINE

## 2017-07-21 ENCOUNTER — Other Ambulatory Visit: Payer: Self-pay | Admitting: Family Medicine

## 2017-07-22 ENCOUNTER — Telehealth: Payer: Self-pay | Admitting: Obstetrics and Gynecology

## 2017-07-22 NOTE — Telephone Encounter (Signed)
Left message to call Jamieson Hetland at [phone].  Patient has a history of having a D&E due to plateau of HCG level in mid 30 range.

## 2017-07-22 NOTE — Telephone Encounter (Signed)
Patient returning Kaitlyn's call. °

## 2017-07-22 NOTE — Telephone Encounter (Signed)
Patient stated that she had a full period, and then continued to have prolonged spotting. Patient stated that she just took a pregnancy test to make sure and it was positive. Patient was told that she needed to have tests done, if she became pregnant again "due to what happened last time."

## 2017-07-22 NOTE — Telephone Encounter (Signed)
Spoke with patient. Patient states last month her menses lasted 3-4 days then had spotting for another 4 days after. This month menses started on 4/14. Had normal bleeding for 4 days and then started spotting. Spotting has continued through today. Took UPT which is positive. Denies heavy bleeding or pain. Having slight nausea. Has a history of D&E 5/18. Patient declines to be seen today due to schedule. Requests to see Dr.Jertson tomorrow. Appointment scheduled for tomorrow 07/23/2017 at 11:30 am with Dr.Jertson. Patient is agreeable to date and time. Advised if she has increased bleeding or pain will need to be seen for further evaluation at local ER.  Routing to provider for final review. Patient agreeable to disposition. Will close encounter.

## 2017-07-23 ENCOUNTER — Encounter: Payer: Self-pay | Admitting: Obstetrics and Gynecology

## 2017-07-23 ENCOUNTER — Ambulatory Visit: Payer: BLUE CROSS/BLUE SHIELD | Admitting: Obstetrics and Gynecology

## 2017-07-23 ENCOUNTER — Other Ambulatory Visit: Payer: Self-pay

## 2017-07-23 ENCOUNTER — Telehealth: Payer: Self-pay

## 2017-07-23 VITALS — BP 112/78 | HR 80 | Resp 14 | Wt 186.0 lb

## 2017-07-23 DIAGNOSIS — O200 Threatened abortion: Secondary | ICD-10-CM | POA: Diagnosis not present

## 2017-07-23 DIAGNOSIS — Z3201 Encounter for pregnancy test, result positive: Secondary | ICD-10-CM

## 2017-07-23 LAB — POCT URINE PREGNANCY: Preg Test, Ur: POSITIVE — AB

## 2017-07-23 LAB — BETA HCG QUANT (REF LAB): hCG Quant: 74 m[IU]/mL
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
## 2017-07-23 NOTE — Telephone Encounter (Signed)
-----   Message from Romualdo BolkJill Evelyn Jertson, MD sent at 07/23/2017  1:59 PM EDT ----- Please inform the patient and have her return of Friday [facility] (should be in the am)

## 2017-07-23 NOTE — Telephone Encounter (Signed)
Spoke with patient and notified of Bhcg results. Made appointment for 07-25-17 10:00am to repeat Bhcg. Reminded patient to call if heavy bleeding or abdominal pain.

## 2017-07-25 ENCOUNTER — Other Ambulatory Visit (INDEPENDENT_AMBULATORY_CARE_PROVIDER_SITE_OTHER): Payer: BLUE CROSS/BLUE SHIELD

## 2017-07-25 ENCOUNTER — Telehealth: Payer: Self-pay

## 2017-07-25 DIAGNOSIS — O209 Hemorrhage in early pregnancy, unspecified: Secondary | ICD-10-CM

## 2017-07-25 DIAGNOSIS — Z3201 Encounter for pregnancy test, result positive: Secondary | ICD-10-CM

## 2017-07-25 LAB — BETA HCG QUANT (REF LAB): hCG Quant: 114 m[IU]/mL

## 2017-07-25 NOTE — Telephone Encounter (Signed)
Spoke with patient. Results given. Patient verbalizes understanding. States that she is waiting to hear regarding a meeting she has on Monday and will return call to schedule lab work.

## 2017-07-25 NOTE — Telephone Encounter (Signed)
Left message to call Kaitlyn at [phone]. 

## 2017-07-25 NOTE — Progress Notes (Unsigned)
labs

## 2017-07-25 NOTE — Telephone Encounter (Signed)
-----   Message from Romualdo BolkJill Evelyn Jertson, MD sent at 07/25/2017  1:04 PM EDT ----- Please let the patient know that her hCG has increased to 114. This is a 65% risk. Often the hCG will double in 48 hours, but any rise over 35% can be consistent with a viable pregnancy. I would recommend that she have a stat BhCG on Monday morning (it should double in 72 hours). She should call if she is having heavy bleeding or pain over the weekend

## 2017-07-26 LAB — PROGESTERONE: PROGESTERONE: 3.5 ng/mL

## 2017-07-28 ENCOUNTER — Other Ambulatory Visit (INDEPENDENT_AMBULATORY_CARE_PROVIDER_SITE_OTHER): Payer: BLUE CROSS/BLUE SHIELD

## 2017-07-28 ENCOUNTER — Telehealth: Payer: Self-pay | Admitting: *Deleted

## 2017-07-28 DIAGNOSIS — R799 Abnormal finding of blood chemistry, unspecified: Secondary | ICD-10-CM

## 2017-07-28 DIAGNOSIS — IMO0002 Reserved for concepts with insufficient information to code with codable children: Secondary | ICD-10-CM

## 2017-07-28 DIAGNOSIS — Z3201 Encounter for pregnancy test, result positive: Secondary | ICD-10-CM

## 2017-07-28 DIAGNOSIS — O200 Threatened abortion: Secondary | ICD-10-CM

## 2017-07-28 LAB — BETA HCG QUANT (REF LAB): HCG QUANT: 209 m[IU]/mL

## 2017-07-28 NOTE — Telephone Encounter (Signed)
Please let pt know that HCG typically doubles within 59 hours and it is passed that time frame so this is not going up appropriately.  Progesterone level is low and should be >5.  Recommend PUS tomorrow to ensure tubes look normal.  Please give bleeding and pain precautions.  Thanks.  CC:  Dr. Oscar La

## 2017-07-28 NOTE — Telephone Encounter (Signed)
Patient called and was scheduled by the front desk for today at 1:30 pm for lab work. STAT hcg quant order placed.  Will review with Dr.Miller as Dr.Jertson will be out of the office today.  Routing to provider for final review. Patient agreeable to disposition. Will close encounter.

## 2017-07-28 NOTE — Telephone Encounter (Signed)
Helmut Muster calling with STAT labs.  Hcg quant: 209  Routing to Dr. Hyacinth Meeker, please review and advise?   Cc: Dr. Oscar La

## 2017-07-28 NOTE — Telephone Encounter (Signed)
Spoke with patient, advised as seen below per Dr. Hyacinth Meeker. PUS scheduled for 07/29/17 at 1pm with consult at 1:30pm with Dr. Oscar La. ER precautions reviewed. Patient aware to seek immediate care at [facility]  If bleeding increases or pain develops.   Order placed for PUS.   Routing to provider for final review. Patient is agreeable to disposition. Will close encounter.  Cc: Harland Dingwall

## 2017-07-28 NOTE — Telephone Encounter (Signed)
Patient in office for lab appointment, has additional questions.   1. Reports diarrhea for 2 days. Denies pain, fever/chills, N/V. Spotting has been consistent, no changes in flow.   2. Progesterone lab results?  Reviewed with Dr. Hyacinth Meeker while in office. Advised to continue to monitor diarrhea. Hydrate well. F/u with PCP/Urgent care if symptoms persisit. [facility] precautions provided for bleeding, pain, symptoms worsen or new symptoms develop. Our office will return call with results once called to office and reviewed by provider. Patient verbalizes understanding.   Routing to provider for final review. Patient is agreeable to disposition. Will close encounter.   Cc: Dr. Oscar La

## 2017-07-29 ENCOUNTER — Ambulatory Visit (INDEPENDENT_AMBULATORY_CARE_PROVIDER_SITE_OTHER): Payer: BLUE CROSS/BLUE SHIELD | Admitting: Obstetrics and Gynecology

## 2017-07-29 ENCOUNTER — Other Ambulatory Visit: Payer: Self-pay

## 2017-07-29 ENCOUNTER — Encounter: Payer: Self-pay | Admitting: Obstetrics and Gynecology

## 2017-07-29 ENCOUNTER — Ambulatory Visit (INDEPENDENT_AMBULATORY_CARE_PROVIDER_SITE_OTHER): Payer: BLUE CROSS/BLUE SHIELD

## 2017-07-29 VITALS — BP 122/70 | HR 80 | Resp 16 | Wt 188.0 lb

## 2017-07-29 DIAGNOSIS — O200 Threatened abortion: Secondary | ICD-10-CM | POA: Diagnosis not present

## 2017-07-29 DIAGNOSIS — IMO0002 Reserved for concepts with insufficient information to code with codable children: Secondary | ICD-10-CM

## 2017-07-29 DIAGNOSIS — O209 Hemorrhage in early pregnancy, unspecified: Secondary | ICD-10-CM | POA: Diagnosis not present

## 2017-07-29 DIAGNOSIS — R799 Abnormal finding of blood chemistry, unspecified: Secondary | ICD-10-CM
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
## 2017-07-30 ENCOUNTER — Telehealth: Payer: Self-pay | Admitting: *Deleted

## 2017-07-30 ENCOUNTER — Other Ambulatory Visit (INDEPENDENT_AMBULATORY_CARE_PROVIDER_SITE_OTHER): Payer: BLUE CROSS/BLUE SHIELD

## 2017-07-30 DIAGNOSIS — O209 Hemorrhage in early pregnancy, unspecified: Secondary | ICD-10-CM

## 2017-07-30 NOTE — Telephone Encounter (Signed)
Patient stat quant BHCG result not available. Reviewed with Dr Oscar La.  Call to patient at approximately [phone].Left message on cell number (per ROI) message has first and last name confirmation. Left message advising result not yet available from lab and will call when received.  Call to patient at approximately [phone]. Left message with update that lab still not available. Calling for status report. Left my cell number to call back.  Patient returned call. Reports vague left discomfort in lower abdomen in area of "ultrasound yesterday". States this began after eating and may be related to GI issues which are not unusual for her. Has not had BM today. Minimal bleeding, just with wiping. Discomfort 2-3/10. No severe pain.  Advised will update MD and call back.   Phone report to Dr Oscar La who will contact patient directly.  Multiple contacts to [facility] for BHCG ressult.

## 2017-07-31 ENCOUNTER — Other Ambulatory Visit: Payer: Self-pay | Admitting: Obstetrics and Gynecology

## 2017-07-31 DIAGNOSIS — O209 Hemorrhage in early pregnancy, unspecified: Secondary | ICD-10-CM

## 2017-07-31 LAB — BETA HCG QUANT (REF LAB): HCG QUANT: 204 m[IU]/mL

## 2017-07-31 NOTE — Telephone Encounter (Signed)
I spoke with the patient last night. She c/o mild lower abdominal discomfort 1-3/10 in severity. Thought she might be constipated. Continued spotting. She stated drinking coffee typically helps her have a BM. Advised to try the coffee and tylenol and if her pain increased she needed to be seen.

## 2017-08-01 ENCOUNTER — Other Ambulatory Visit: Payer: Self-pay | Admitting: Obstetrics and Gynecology

## 2017-08-01 ENCOUNTER — Other Ambulatory Visit: Payer: BLUE CROSS/BLUE SHIELD

## 2017-08-01 DIAGNOSIS — O209 Hemorrhage in early pregnancy, unspecified: Secondary | ICD-10-CM

## 2017-08-01 LAB — CBC
Hematocrit: 41.3 % (ref 34.0–46.6)
Hemoglobin: 14.3 g/dL (ref 11.1–15.9)
MCH: 28.9 pg (ref 26.6–33.0)
MCHC: 34.6 g/dL (ref 31.5–35.7)
MCV: 84 fL (ref 79–97)
PLATELETS: 289 10*3/uL (ref 150–379)
RBC: 4.94 x10E6/uL (ref 3.77–5.28)
RDW: 14.3 % (ref 12.3–15.4)
WBC: 6.2 10*3/uL (ref 3.4–10.8)

## 2017-08-01 LAB — BETA HCG QUANT (REF LAB): hCG Quant: 82 m[IU]/mL

## 2017-08-01 LAB — COMPREHENSIVE METABOLIC PANEL
ALBUMIN: 4.5 g/dL (ref 3.5–5.5)
ALT: 16 IU/L (ref 0–32)
AST: 14 IU/L (ref 0–40)
Albumin/Globulin Ratio: 1.9 (ref 1.2–2.2)
Alkaline Phosphatase: 47 IU/L (ref 39–117)
BILIRUBIN TOTAL: 0.5 mg/dL (ref 0.0–1.2)
BUN / CREAT RATIO: 14 (ref 9–23)
BUN: 11 mg/dL (ref 6–20)
CHLORIDE: 106 mmol/L (ref 96–106)
CO2: 26 mmol/L (ref 20–29)
CREATININE: 0.77 mg/dL (ref 0.57–1.00)
Calcium: 9.5 mg/dL (ref 8.7–10.2)
GFR calc Af Amer: 116 mL/min/{1.73_m2} (ref >59)
GFR calc non Af Amer: 100 mL/min/{1.73_m2} (ref >59)
GLOBULIN, TOTAL: 2.4 g/dL (ref 1.5–4.5)
GLUCOSE: 105 mg/dL — AB (ref 65–99)
Potassium: 4.6 mmol/L (ref 3.5–5.2)
SODIUM: 141 mmol/L (ref 134–144)
Total Protein: 6.9 g/dL (ref 6.0–8.5)

## 2017-08-04 ENCOUNTER — Other Ambulatory Visit: Payer: Self-pay | Admitting: *Deleted

## 2017-08-04 ENCOUNTER — Telehealth: Payer: Self-pay | Admitting: *Deleted

## 2017-08-04 ENCOUNTER — Other Ambulatory Visit: Payer: BLUE CROSS/BLUE SHIELD

## 2017-08-04 DIAGNOSIS — O209 Hemorrhage in early pregnancy, unspecified: Secondary | ICD-10-CM

## 2017-08-04 LAB — BETA HCG QUANT (REF LAB): hCG Quant: 16 m[IU]/mL

## 2017-08-04 MED ORDER — ONDANSETRON HCL 4 MG PO TABS: 4.0000 mg | ORAL_TABLET | Freq: Three times a day (TID) | ORAL | 0 refills | Status: DC | PRN

## 2017-08-04 NOTE — Telephone Encounter (Signed)
Spoke with patient, advised as seen below per Dr. Oscar La. Rx for Zofran to verified pharmacy. Patient denies pain at this time, PUS cancelled for 08/05/17. Patient aware to return call with any concerns. Will close encounter.

## 2017-08-04 NOTE — Telephone Encounter (Signed)
Zofran is fine. 4 mg po q 8 hours prn, #20, no refills. She doesn't need the ultrasound unless she is having pain.

## 2017-08-04 NOTE — Telephone Encounter (Signed)
-----   Message from Romualdo Bolk, MD sent at 08/04/2017  3:20 PM EDT ----- Please inform the patient that her BhcG has gone down well (for SAB). Check on how she is doing. Set her up for another BhcG on Friday am (stat)

## 2017-08-04 NOTE — Telephone Encounter (Signed)
Notes recorded by Leda Min, RN on 08/04/2017 at 3:33 PM EDT Spoke with patient. Advised as seen below per Dr. Oscar La. Lab appt scheduled for 08/08/17 at 9:15am, STAT lab order placed. Reports bleeding as spotting, denies pain. Patient has additional questions for provider. See telephone encounter dated 08/04/17 to review with provider.   1. Reports intermittent nausea, Zofran has worked well in past, Astronomer.  2. PUS previously scheduled for 08/05/17, is this still recommended?

## 2017-08-05 ENCOUNTER — Other Ambulatory Visit: Payer: BLUE CROSS/BLUE SHIELD

## 2017-08-05 ENCOUNTER — Other Ambulatory Visit: Payer: BLUE CROSS/BLUE SHIELD | Admitting: Obstetrics and Gynecology

## 2017-08-05 LAB — HGB A1C W/O EAG: Hgb A1c MFr Bld: 5.1 % (ref 4.8–5.6)

## 2017-08-08 ENCOUNTER — Telehealth: Payer: Self-pay

## 2017-08-08 ENCOUNTER — Other Ambulatory Visit (INDEPENDENT_AMBULATORY_CARE_PROVIDER_SITE_OTHER): Payer: BLUE CROSS/BLUE SHIELD

## 2017-08-08 DIAGNOSIS — O209 Hemorrhage in early pregnancy, unspecified: Secondary | ICD-10-CM

## 2017-08-08 LAB — BETA HCG QUANT (REF LAB): HCG QUANT: 3 m[IU]/mL

## 2017-08-08 NOTE — Telephone Encounter (Signed)
Spoke with patient. Advised HCG testing returned normal at 3. Reviewed with Dr.Jertson who recommends that she had HSG testing with her next menses to test tubal patency. Patient verbalizes understanding and will contact the office with the first day of her menses so that this can be scheduled at the hospital.  Routing to Dr.Jertson for review.

## 2017-08-08 NOTE — Telephone Encounter (Signed)
Left message to call Kaitlyn at [phone]. 

## 2017-09-09 ENCOUNTER — Other Ambulatory Visit: Payer: Self-pay | Admitting: Family Medicine

## 2017-09-09 NOTE — Telephone Encounter (Signed)
Rx refill request: Mobic 15 mg   Last filled 06/23/17 #30 possible short term medication  LOV: 06/23/17  PCP: Clelia CroftShaw  Pharmacy: verified

## 2017-09-12 ENCOUNTER — Other Ambulatory Visit: Payer: Self-pay | Admitting: Obstetrics and Gynecology

## 2017-09-12 DIAGNOSIS — N979 Female infertility, unspecified: Secondary | ICD-10-CM

## 2017-09-12 NOTE — Telephone Encounter (Signed)
Per review of telephone encounter dated 08/08/17 -HSG testing with menses for tubal patency.  Spoke with patient. LMP 09/08/17. [facility] and [facility] call. Advised patient doxycycline 100mg  bid x5 days to be started day of testing. Patient requesting RX for Diflucan with abx. Advised will review with Dr. Oscar LaJertson when she returns to the office on 6/17 and return call. Patient verbalizes understanding.  [facility]   Rx pended for Doxycycline and diflucan.

## 2017-09-12 NOTE — Telephone Encounter (Signed)
Spoke with [facility]. [facility] arriving at 1:30pm for 2pm appointment.   Call returned to patient, advised of appt as seen above. Patient agreeable to date and time. Patient states she discussed  with Dr. Oscar LaJertson additional lab work to determine cause of infertility. Advised no additional labs ordered, will also review with Dr. Oscar LaJertson and return call. Pharmacy confirmed.   Dr. Oscar LaJertson -any additional labs recommended at this time? Please review pended RX.   Cc: Harland DingwallSuzy Dixon, [place] Davis

## 2017-09-12 NOTE — Telephone Encounter (Signed)
Patient is calling to schedule procedure (she cannot remember the name).

## 2017-09-15 MED ORDER — FLUCONAZOLE 150 MG PO TABS: ORAL_TABLET | ORAL | 0 refills | Status: AC

## 2017-09-15 MED ORDER — DOXYCYCLINE HYCLATE 100 MG PO CAPS: 100.0000 mg | ORAL_CAPSULE | Freq: Two times a day (BID) | ORAL | 0 refills | Status: AC

## 2017-09-15 NOTE — Telephone Encounter (Signed)
Scripts sent. Lets see the results of the HSG, then decide on blood work. This is the first step.

## 2017-09-15 NOTE — Telephone Encounter (Signed)
Spoke with patient, advised as seen below per Dr. Oscar LaJertson. Rx sent to [facility]. Patient verbalizes understanding and is agreeable. Encounter closed.

## 2017-09-17 ENCOUNTER — Ambulatory Visit (HOSPITAL_COMMUNITY)
Admission: RE | Admit: 2017-09-17 | Discharge: 2017-09-17 | Disposition: A | Discharge status: Home / Self Care | Payer: BLUE CROSS/BLUE SHIELD | Source: Ambulatory Visit | Attending: Obstetrics and Gynecology | Admitting: Obstetrics and Gynecology

## 2017-09-17 ENCOUNTER — Telehealth: Payer: Self-pay | Admitting: Obstetrics and Gynecology

## 2017-09-17 DIAGNOSIS — N979 Female infertility, unspecified: Secondary | ICD-10-CM | POA: Diagnosis not present

## 2017-09-17 MED ORDER — IOPAMIDOL (ISOVUE-300) INJECTION 61%
30.0000 mL | Freq: Once | INTRAVENOUS | Status: AC | PRN
  Administered 2017-09-17: 30 mL

## 2017-09-17 NOTE — Telephone Encounter (Signed)
Called patient and left message for her to return my call. 

## 2017-09-17 NOTE — Telephone Encounter (Signed)
Patient left voicemail stating that she has questions regarding a prescription.

## 2017-09-19 ENCOUNTER — Other Ambulatory Visit: Payer: Self-pay | Admitting: *Deleted

## 2017-09-19 DIAGNOSIS — N96 Recurrent pregnancy loss: Secondary | ICD-10-CM

## 2017-09-19 NOTE — Telephone Encounter (Signed)
-----   Message from Romualdo BolkJill Evelyn Jertson, MD sent at 09/18/2017  5:33 PM EDT ----- I've sent the patient the following mychart message:  Emmit AlexandersHi Regina Tyler, Your HSG was normal!! My nurse will call you to discuss your lab work. I would like to check your labs on the 3rd day of your next cycle.  You should be hearing from her in the next few business days. Have a great weekend! Gertie ExonJill Jertson   She needs a cycle D#3 FSH, AMH, anticardiolipin AB (IgG and IgM), lupus anticoagulant, TSH, TPO antibody. If all of that is normal, she needs a karyotype.

## 2017-09-19 NOTE — Telephone Encounter (Signed)
Call to patient. Confirmed she has received result note. States usually has 29 day cycles. Lab appointment scheduled for 10-09-17 pending onset of cycle.  Patient aware to call with menses to confirm correct scheduling of labs .

## 2017-10-07 ENCOUNTER — Telehealth: Payer: Self-pay | Admitting: Family Medicine

## 2017-10-07 NOTE — Telephone Encounter (Signed)
Copied from CRM [id number]. Topic: Quick Communication - Rx Refill/Question >> Oct 07, 2017  3:02 PM Gaynelle AduPoole, Shalonda wrote: Medication:  Adderall  Has the patient contacted their pharmacy? no  Preferred Pharmacy (with phone number or street name): [facility] [address] - [place], [place] - [id number] [address] [place] [address] Phone: [phone] Fax: [phone]    Agent: Please be advised that RX refills may take up to 3 business days. We ask that you follow-up with your pharmacy.

## 2017-10-08 ENCOUNTER — Telehealth: Payer: Self-pay | Admitting: Obstetrics and Gynecology

## 2017-10-08 NOTE — Telephone Encounter (Signed)
[name], confirmed fax received for labs. Reviewed requested labs to confirm. Labs read back.   Spoke with patient. Advised [facility] for [facility] and confirmed.  Patient aware if menses starts on 7/11 labs on 7/13. If menses starts on 7/12, labs on 7/14. Patient verbalizes understanding.   Routing to provider for final review. Patient is agreeable to disposition. Will close encounter.

## 2017-10-08 NOTE — Telephone Encounter (Signed)
[facility] at [phone].

## 2017-10-08 NOTE — Telephone Encounter (Signed)
Spoke with patient. Patient reports menses has not started to date, is scheduled for Day 3 labs on 7/11. Took UPT this morning, negative. Advised patient if day 3 is on Sat or Sun, [facility] Ad[facility], return call to notify if menses starts, can leave message on answering machine if after hours.   10/09/17 lab appt cancelled.   Written order for labs  to Dr. Oscar LaJertson for signature.

## 2017-10-08 NOTE — Telephone Encounter (Signed)
Patient has an appointment tomorrow for day 3 labs, but she has not started her cycle.

## 2017-10-09 ENCOUNTER — Other Ambulatory Visit: Payer: BLUE CROSS/BLUE SHIELD

## 2017-10-11 ENCOUNTER — Other Ambulatory Visit (HOSPITAL_COMMUNITY)
Admission: RE | Admit: 2017-10-11 | Discharge: 2017-10-11 | Disposition: A | Discharge status: Home / Self Care | Payer: BLUE CROSS/BLUE SHIELD | Source: Ambulatory Visit | Attending: Obstetrics and Gynecology | Admitting: Obstetrics and Gynecology

## 2017-10-11 DIAGNOSIS — N96 Recurrent pregnancy loss: Secondary | ICD-10-CM | POA: Diagnosis not present

## 2017-10-11 LAB — TSH: TSH: 1.383 u[IU]/mL (ref 0.350–4.500)

## 2017-10-12 LAB — FOLLICLE STIMULATING HORMONE: FSH: 4.2 m[IU]/mL

## 2017-10-12 LAB — LUPUS ANTICOAGULANT PANEL
DRVVT: 33.7 s (ref 0.0–47.0)
PTT LA: 29.1 s (ref 0.0–51.9)

## 2017-10-12 LAB — THYROID PEROXIDASE ANTIBODY: Thyroperoxidase Ab SerPl-aCnc: 14 IU/mL (ref 0–34)

## 2017-10-12 NOTE — Telephone Encounter (Signed)
Message re: adderall refill sent to Dr. Clelia CroftShaw. Reviewing chart, pt had positive pregnancy test 06/2017. 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

## 2017-10-13 ENCOUNTER — Telehealth: Payer: Self-pay | Admitting: Obstetrics and Gynecology

## 2017-10-13 NOTE — Telephone Encounter (Signed)
Patient called to report [facility], 10/11/[facility]. She left a message on our voice mail after hours stating she was told to call if she went. No other details left on voice mail.

## 2017-10-13 NOTE — Telephone Encounter (Signed)
Routing to Dr. Jertson FYI, will close encounter.  

## 2017-10-14 ENCOUNTER — Telehealth: Payer: Self-pay | Admitting: *Deleted

## 2017-10-14 DIAGNOSIS — N96 Recurrent pregnancy loss: Secondary | ICD-10-CM

## 2017-10-14 LAB — CARDIOLIPIN ANTIBODIES, IGG, IGM, IGA: Anticardiolipin IgG: <9 GPL U/mL (ref 0–14)

## 2017-10-14 NOTE — Telephone Encounter (Signed)
Spoke with patient, advised as seen below per Dr. Oscar LaJertson.   1. Patient requesting more information about karyotypes.  2. Requesting referral to [facility]. Asking if Dr. Oscar LaJertson is familiar with any providers within their system?   Advised will review with Dr. Oscar LaJertson and return call, patient agreeable.   Dr. Oscar LaJertson -please advise.

## 2017-10-14 NOTE — Telephone Encounter (Signed)
   10:46 AM  Romualdo BolkJertson, Juanice Warburton Evelyn, MD routed this conversation to Me     Romualdo BolkJertson, Gearld Kerstein Evelyn, MD       Can you please print off all of her labs for me so I can sign them. Her AMH is pending, everything else is normal. So far this is no explanation for her pregnancy losses. Normal HSG and labs.  [facility], please watch for it.  Even though she has had 2 losses, the odds are in her favor that she will have a successful pregnancy.  There is the option of check karyotypes on her and her husband, it is expensive and not very likely to be abnormal. I would recommend not checking this unless she were to have another loss, but we can do that now if she prefer.  There aren't great treatment options for unexplained recurrent pregnancy loss. Lifestyle modification can help, for example limit caffeine, no ETOH, no smoking can be helpful. The next option would be to send her to an infertility specialist

## 2017-10-14 NOTE — Telephone Encounter (Addendum)
Can you please print off all of her labs for me so I can sign them. Her AMH is pending, everything else is normal. So far this is no explanation for her pregnancy losses. Normal HSG and labs.  [facility].  Even though she has had 2 losses, the odds are in her favor that she will have a successful pregnancy.  There is the option of check karyotypes on her and her husband, it is expensive and not very likely to be abnormal. I would recommend not checking this unless she were to have another loss, but we can do that now if she prefer.  There aren't great treatment options for unexplained recurrent pregnancy loss. Lifestyle modification can help, for example limit caffeine, no ETOH, no smoking can be helpful. The next option would be to send her to an infertility specialist

## 2017-10-14 NOTE — Telephone Encounter (Signed)
Encounter previously closed, see telephone encounter dated 7/16.

## 2017-10-14 NOTE — Telephone Encounter (Signed)
The karyotypes are checking her chromosomes and her husbands chromosomes. Blood tests.  Please put in referral to [facility], I don't know anyone there please check with Dr Tomie ChinaMiller/Silva if they know someone there.  I would wait to talk with them about Karyotype.

## 2017-10-15 NOTE — Telephone Encounter (Signed)
She likely had a low progesterone level because the pregnancy wasn't normal, not the other way around. The general consensus is not to use progesterone, it has not been proven to increase the live birth rate. It is something she can discuss further with the infertility specialist and get their opinion.

## 2017-10-15 NOTE — Telephone Encounter (Signed)
Spoke with patient, advised as seen below per Dr. Oscar LaJertson. Patient will contact [facility] directly with additional questions regarding cost, will return call to advise on referral.   Patient asking if progesterone would be appropriate to try since her progesterone was low in the past and she still experienced menses with pregnancy? Advised will review with Dr. Oscar LaJertson and return call.   Dr. Oscar LaJertson, please review.

## 2017-10-15 NOTE — Telephone Encounter (Signed)
Pt called in checking the status of her Adderall Rx refill request. Pt says that she has been waiting for refill for a week and would like a status update. I did advise that Rx request has been sent to provider.    Please advise pt further.

## 2017-10-15 NOTE — Telephone Encounter (Signed)
Left message to call Noreene LarssonJill at [phone].   [facility] Dr. Regan LemmingJennifer Eaton [facility] End[facility]inology and Infertility Specialist [phone]

## 2017-10-15 NOTE — Telephone Encounter (Signed)
Spoke with patient, advised as seen below per Dr. Reyne DumasJerston. Patient request to proceed with [facility], Dr. Roderic ScarceEaton.   Advised referral order placed. Our office referral coordinator with f/u with scheduling.   Routing to provider for final review. Patient is agreeable to disposition. Will close encounter.  Cc: Regina Tyler

## 2017-10-16 LAB — ANTI MULLERIAN HORMONE: ANTI-MULLERIAN HORMONE (AMH): 4.49 ng/mL

## 2017-10-21 ENCOUNTER — Encounter: Payer: Self-pay | Admitting: Family Medicine

## 2017-10-21 MED ORDER — AMPHETAMINE-DEXTROAMPHETAMINE 20 MG PO TABS: 20.0000 mg | ORAL_TABLET | Freq: Three times a day (TID) | ORAL | 0 refills | Status: DC

## 2017-10-21 NOTE — Addendum Note (Signed)
Addended by: Sherren MochaSHAW, EVA N on: 10/21/2017 10:00 PM   Modules accepted: Orders

## 2017-10-21 NOTE — Telephone Encounter (Addendum)
rx sent in x 3 mos for 7/23, 8/22, and 9/21. F/u OV for any refills. Sent mychart note to alert pt.

## 2017-10-22 ENCOUNTER — Telehealth: Payer: Self-pay | Admitting: Obstetrics and Gynecology

## 2017-10-22 NOTE — Telephone Encounter (Signed)
Please inform the patient that it was normal.

## 2017-10-22 NOTE — Telephone Encounter (Signed)
Dr. Oscar LaJertson -[facility] 10[facility]11[facility]17 and advise.

## 2017-10-22 NOTE — Telephone Encounter (Signed)
Patient called to see if her "hormone test" results are back yet.

## 2017-10-22 NOTE — Telephone Encounter (Signed)
No answer, mailbox full

## 2017-10-24 ENCOUNTER — Telehealth: Payer: Self-pay | Admitting: Obstetrics and Gynecology

## 2017-10-24 NOTE — Telephone Encounter (Signed)
Spoke with patient, advised as seen below per Dr. Jertson. Patient verbalizes understanding and is agreeable. Encounter closed.  

## 2017-10-24 NOTE — Telephone Encounter (Signed)
Call placed in reference to referral to [facility].

## 2017-11-23 ENCOUNTER — Other Ambulatory Visit: Payer: Self-pay | Admitting: Family Medicine

## 2017-11-24 NOTE — Telephone Encounter (Signed)
Meloxicam refill Last Refill:06/23/17 # 30 Last OV: 06/23/17 PCP: Dr Clelia CroftShaw Pharmacy:[facility] [phone] [address]  Cyclobenzaprine refill Last Refill:03/08/17 # 30 5 RF Last OV: 03/08/17

## 2017-12-02 ENCOUNTER — Telehealth: Payer: Self-pay | Admitting: Obstetrics and Gynecology

## 2017-12-02 NOTE — Telephone Encounter (Signed)
Placed call in reference to a referral.

## 2017-12-25 MED ORDER — MELOXICAM 15 MG PO TABS: 15.0000 mg | ORAL_TABLET | Freq: Every day | ORAL | 2 refills | Status: DC

## 2018-02-17 ENCOUNTER — Other Ambulatory Visit: Payer: Self-pay | Admitting: Family Medicine

## 2018-02-17 NOTE — Telephone Encounter (Signed)
Copied from CRM [id number]. Topic: Quick Communication - Rx Refill/Question >> Feb 17, 2018  2:04 PM Regina FurbishIlderton, [name] wrote: Medication: amphetamine-dextroamphetamine (ADDERALL) 20 MG tablet [[id number]][[id number]] cyclobenzaprine (FLEXERIL) 10 MG tablet [[id number]][[id number]]  pt has ran out and can not get appointment until 03/06/18.   Has the patient contacted their pharmacy? [facility]erred [facility] (with phone number or street name[facility] [address] - [place], [place] - [id number] [address] [facility] [place] [phone] (Phone) [phone] (Fax)   Agent: Please be advised that RX refills may take up to 3 business days. We ask that you follow-up with your pharmacy.

## 2018-02-18 NOTE — Telephone Encounter (Signed)
Requested medication (s) are due for refill today: Adderall yes  Requested medication (s) are on the active medication list: Adderall is, flexeril is not    Last refill: Adderall 10/21/17  #90 0 refills  Future visit scheduled yes 03/06/18 Dr. Clelia CroftShaw  Notes to clinic:not delegated   Note: Flexeril not on current med profile  LRF 12.8.18  Requested Prescriptions  Pending Prescriptions Disp Refills   amphetamine-dextroamphetamine (ADDERALL) 20 MG tablet 90 tablet 0    Sig: Take 1 tablet (20 mg total) by mouth 3 (three) times daily.     Not Delegated - Psychiatry:  Stimulants/ADHD Failed - 02/18/2018  3:32 PM      Failed - This refill cannot be delegated      Failed - Urine Drug Screen completed in last 360 days.      Failed - Valid encounter within last 3 months    Recent Outpatient Visits          8 months ago Attention deficit disorder (ADD) without hyperactivity   [facility], Levell JulyEva N, MD   11 months ago Lumbar radiculopathy   [facility], Levell JulyEva N, MD   1 year ago Chronic midline low back pain without sciatica   [facility], Levell JulyEva N, MD   1 year ago Eye pain, right   [facility], Marolyn HammockMichael L, PA-C   2 years ago Attention deficit hyperactivity disorder (ADHD), predominantly inattentive type   [facility], Levell JulyEva N, MD      Future Appointments            In 2 weeks Sherren MochaShaw, Eva N, MD [facility]

## 2018-02-18 NOTE — Telephone Encounter (Signed)
Cyclobenzaprine is not listed on the medication list. / Patient is asking for a refill of Adderall.  Please note multiple prescriptions listed

## 2018-02-19 NOTE — Telephone Encounter (Signed)
Patient is requesting a refill of the following medications: Requested Prescriptions   Pending Prescriptions Disp Refills  . amphetamine-dextroamphetamine (ADDERALL) 20 MG tablet 90 tablet 0    Sig: Take 1 tablet (20 mg total) by mouth 3 (three) times daily.    Date of patient request:02/17/2018 Last office visit: 06/23/2017 Date of last refill: 12/20/2017 Last refill amount: 90 Follow up time period per chart:

## 2018-03-02 MED ORDER — AMPHETAMINE-DEXTROAMPHETAMINE 20 MG PO TABS: 20.0000 mg | ORAL_TABLET | Freq: Three times a day (TID) | ORAL | 0 refills | Status: DC

## 2018-03-02 MED ORDER — CYCLOBENZAPRINE HCL 10 MG PO TABS: 10.0000 mg | ORAL_TABLET | Freq: Three times a day (TID) | ORAL | 0 refills | Status: DC | PRN

## 2018-03-02 NOTE — Telephone Encounter (Signed)
Flexeril and adderall refilled - needs to keep 12/6 appt for further refills - please let pt know refills have been sent in.

## 2018-03-04 ENCOUNTER — Telehealth: Payer: Self-pay | Admitting: Obstetrics and Gynecology

## 2018-03-04 ENCOUNTER — Other Ambulatory Visit (INDEPENDENT_AMBULATORY_CARE_PROVIDER_SITE_OTHER): Payer: BLUE CROSS/BLUE SHIELD

## 2018-03-04 DIAGNOSIS — Z3201 Encounter for pregnancy test, result positive: Secondary | ICD-10-CM

## 2018-03-04 LAB — PROGESTERONE: PROGESTERONE: 1.8 ng/mL

## 2018-03-04 LAB — BETA HCG QUANT (REF LAB): HCG QUANT: 112 m[IU]/mL

## 2018-03-04 NOTE — Telephone Encounter (Signed)
Have her come in for a stat BhcG BT O+

## 2018-03-04 NOTE — Telephone Encounter (Signed)
Spoke with patient, advised as seen below per Dr. Oscar LaJertson. Lab appt scheduled for today at 1:30pm. Future lab orders placed.

## 2018-03-04 NOTE — Telephone Encounter (Signed)
Verbal report of stat labs called to Dr Oscar LaJertson. Orders received for repeat stat HCG on Friday, 12-6.  Call to patient. Lab results reviewed. Potential for non-viable pregnancy reviewed. Precautions for heavy bleeding, abdominal/pelvic pain, any unusual symptoms or change in status, needs to seek care at [facility] or nearest ED. Advised MD on call 24/7.   Patient with multiple questions and concerns. Advised can schedule office visit with Dr Oscar LaJertson tomorrow to discuss personally. Appointment 03-05-18 at 330 pm. Support provided. Call time 13 minutes.    Routing to Dr Oscar LaJertson. Encounter closed.    CC: Dr Edward JollySilva- provider on call.

## 2018-03-04 NOTE — Telephone Encounter (Signed)
Patient took positive UPT. Has had ectopic and miscarriage previously. Would like to be seen today for blood work. Unsure of what kind of appointment to schedule patient for.

## 2018-03-04 NOTE — Telephone Encounter (Signed)
Spoke with patient. Positive UPT today. LMP 02/18/18. Reports nausea and intermittent brown d/c. Denies bleeding or pain. Hx of ectopic pregnancy and SAB. Patient requesting OV today for labs. Advised I will review with Dr. Oscar LaJertson and return call, patient agreeable.   Dr. Oscar LaJertson -please advise.

## 2018-03-05 ENCOUNTER — Encounter: Payer: Self-pay | Admitting: Obstetrics and Gynecology

## 2018-03-05 ENCOUNTER — Ambulatory Visit: Payer: BLUE CROSS/BLUE SHIELD | Admitting: Obstetrics and Gynecology

## 2018-03-05 ENCOUNTER — Other Ambulatory Visit: Payer: Self-pay

## 2018-03-05 VITALS — BP 110/74 | HR 72 | Wt 175.0 lb

## 2018-03-05 DIAGNOSIS — Z8759 Personal history of other complications of pregnancy, childbirth and the puerperium: Secondary | ICD-10-CM | POA: Diagnosis not present

## 2018-03-05 DIAGNOSIS — N96 Recurrent pregnancy loss: Secondary | ICD-10-CM

## 2018-03-05 DIAGNOSIS — O209 Hemorrhage in early pregnancy, unspecified: Secondary | ICD-10-CM

## 2018-03-05 MED ORDER — PROGESTERONE 8 % VA GEL: 90.0000 mg | Freq: Every day | VAGINAL | 8 refills | Status: DC
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
## 2018-03-06 ENCOUNTER — Encounter: Payer: Self-pay | Admitting: Obstetrics and Gynecology

## 2018-03-06 ENCOUNTER — Telehealth: Payer: Self-pay | Admitting: Obstetrics and Gynecology

## 2018-03-06 ENCOUNTER — Other Ambulatory Visit (INDEPENDENT_AMBULATORY_CARE_PROVIDER_SITE_OTHER): Payer: BLUE CROSS/BLUE SHIELD

## 2018-03-06 ENCOUNTER — Ambulatory Visit: Payer: BLUE CROSS/BLUE SHIELD | Admitting: Family Medicine

## 2018-03-06 DIAGNOSIS — Z8759 Personal history of other complications of pregnancy, childbirth and the puerperium: Secondary | ICD-10-CM

## 2018-03-06 DIAGNOSIS — O209 Hemorrhage in early pregnancy, unspecified: Secondary | ICD-10-CM

## 2018-03-06 LAB — BETA HCG QUANT (REF LAB): HCG QUANT: 184 m[IU]/mL

## 2018-03-06 NOTE — Telephone Encounter (Signed)
Lab results called to Dr Oscar LaJertson.  Call to patient. Advised of results and implications for non-viable pregnancy. Ectopic precautions reviewed and advised MD on call 24/7 for any change in status.  Has lab appointment on 03-09-18.    Routing to Dr Oscar LaJertson. Encounter closed.

## 2018-03-06 NOTE — Telephone Encounter (Signed)
I called the patient and left a message that we are still waiting on her BhcG, someone will call her with results.

## 2018-03-09 ENCOUNTER — Other Ambulatory Visit: Payer: Self-pay

## 2018-03-09 ENCOUNTER — Other Ambulatory Visit: Payer: BLUE CROSS/BLUE SHIELD

## 2018-03-09 ENCOUNTER — Telehealth: Payer: Self-pay | Admitting: Obstetrics and Gynecology

## 2018-03-09 ENCOUNTER — Other Ambulatory Visit (INDEPENDENT_AMBULATORY_CARE_PROVIDER_SITE_OTHER): Payer: BLUE CROSS/BLUE SHIELD

## 2018-03-09 ENCOUNTER — Other Ambulatory Visit: Payer: Self-pay | Admitting: Obstetrics and Gynecology

## 2018-03-09 DIAGNOSIS — O209 Hemorrhage in early pregnancy, unspecified: Secondary | ICD-10-CM

## 2018-03-09 DIAGNOSIS — N96 Recurrent pregnancy loss: Secondary | ICD-10-CM

## 2018-03-09 LAB — BETA HCG QUANT (REF LAB): HCG QUANT: 65 m[IU]/mL

## 2018-03-09 NOTE — Progress Notes (Signed)
Order for STAT beta hcg placed to be drawn today.

## 2018-03-09 NOTE — Telephone Encounter (Signed)
Patient is asking for a recent lab results. Patient is also inquiring about the documentation that was supposed to sent to her pharmacy for her refill request.

## 2018-03-10 NOTE — Telephone Encounter (Signed)
Spoke with patient. Advised of message as seen below from Dr.Jertson. Patient verbalizes understanding. Lab appointment scheduled for 03/12/2018 at 9 am. Patient is agreeable to date and time. Order placed. Patient is [place]. Referral to [facility] placed. Aware she will be contacted to schedule an appointment.  Routing to [employer] Hamm regarding PA.

## 2018-03-10 NOTE — Telephone Encounter (Signed)
Dr. Oscar LaJertson -does patient need to discontinue progesterone vaginal gel?

## 2018-03-10 NOTE — Telephone Encounter (Signed)
Patient left message over lunch returning call.

## 2018-03-10 NOTE — Telephone Encounter (Signed)
[facility] for Crinone 8% gel. [facility] was advised medication only available in brand, no generic.

## 2018-03-10 NOTE — Telephone Encounter (Signed)
Left message to call Kaitlyn at [phone]. 

## 2018-03-10 NOTE — Telephone Encounter (Signed)
-----   Message from Romualdo BolkJill Evelyn Jertson, MD sent at 03/09/2018  4:54 PM EST ----- Called patient, left a message on her cell phone (per DPR). Advised that her BhcG was dropping, that we should recheck it on Thursday, and that she should call with any concerns.  Please make sure the patient gets on the lab schedule for Thursday morning and replace the referral to the [facility] for recurrent 1st trimester pregnancy loss.

## 2018-03-10 NOTE — Telephone Encounter (Signed)
I don't think there is any benefit for her to be on vaginal progesterone.

## 2018-03-11 NOTE — Telephone Encounter (Signed)
Left message to call Ashleen Demma, RN at [facility] [phone].   

## 2018-03-12 ENCOUNTER — Other Ambulatory Visit: Payer: Self-pay | Admitting: Obstetrics and Gynecology

## 2018-03-12 ENCOUNTER — Other Ambulatory Visit (INDEPENDENT_AMBULATORY_CARE_PROVIDER_SITE_OTHER): Payer: BLUE CROSS/BLUE SHIELD

## 2018-03-12 DIAGNOSIS — N939 Abnormal uterine and vaginal bleeding, unspecified: Secondary | ICD-10-CM

## 2018-03-12 DIAGNOSIS — O209 Hemorrhage in early pregnancy, unspecified: Secondary | ICD-10-CM

## 2018-03-12 LAB — BETA HCG QUANT (REF LAB): HCG QUANT: 9 m[IU]/mL

## 2018-03-12 NOTE — Telephone Encounter (Signed)
Spoke with patient about beta hcg level drawn today. Level returned at 9. Advised will need a lab recheck in 1 week. Lab appointment scheduled for 03/19/18 at 9 am. Patient is agreeable to date and time. Patient reports she is feeling well. States she is having slight nausea. Denies any vomiting. Not having any pain or bleeding. Has concerns that she is not bleeding at this time. Advised not bleeding at this time with hcg level lowering back to normal can be normal. Advised will review with Dr.Jertson and return call as patient wants to know if she should expect any future bleeding.

## 2018-03-12 NOTE — Telephone Encounter (Signed)
Spoke with patient. Advised of message as seen below from Dr.Jertson. Patient verbalizes understanding. Patient is asking when she may have intercourse again. Reviewed with Dr.Jertson with patient on the phone. Advised she may have protected intercourse until her next cycle. No unprotected intercourse until after next menses. Patient verbalizes understanding. Patient has also been notified that she does not need the progesterone gel. Encounter closed.

## 2018-03-12 NOTE — Telephone Encounter (Signed)
She should bleed at some point, I would anticipate she would have bleeding within a few weeks.

## 2018-03-15 ENCOUNTER — Telehealth: Payer: Self-pay | Admitting: Obstetrics and Gynecology

## 2018-03-15 NOTE — Telephone Encounter (Signed)
The patient called yesterday morning c/o lower abdominal cramping. She has been followed with dropping BhcG's, the last one was 9 on Friday morning. She wasn't sure if the pain was from constipation. She denied bleeding. I advised her to go to the ER, she wanted to wait to see if the pain improved. I called her back several hours later to check on her and she said the pain was still present, but had eased up. Please call and check on her in the am.

## 2018-03-17 ENCOUNTER — Telehealth: Payer: Self-pay | Admitting: *Deleted

## 2018-03-17 DIAGNOSIS — N96 Recurrent pregnancy loss: Secondary | ICD-10-CM

## 2018-03-17 NOTE — Telephone Encounter (Signed)
-----   Message from Romualdo BolkJill Evelyn Jertson, MD sent at 03/16/2018  4:57 PM EST ----- Patient is aware. Please call and check on her, she was having some pain this weekend (see telephone note). She has an appointment for f/u blood work on 03/19/18

## 2018-03-17 NOTE — Telephone Encounter (Signed)
Patient contacted by triage RN today. See telephone encounter dated 03/17/18. Will close encounter.

## 2018-03-17 NOTE — Telephone Encounter (Addendum)
Notes recorded by Leda MinHamm, Jewelz Kobus N, RN on 03/17/2018 at 10:53 AM EST Spoke with patient, advised as seen below per Dr. Oscar LaJertson. Patient reports increased cramping on Saturday, bleeding started on Sunday. Reports flow as normal, cramping is minimal. Is aware of lab appt scheduled for 12/19. Patient requesting referral to [facility], request to cancel referral to [facility], Dr. Roderic ScarceEaton. Patient verbalizes understanding and is agreeable.     Dr. Oscar LaJertson -ok to proceed with referral to Dr. Dr. Lenice LlamasJeffery Deaton?

## 2018-03-17 NOTE — Telephone Encounter (Signed)
Order placed to Dr. Hassell HalimJeffery Deaton  [facility] [address] [place], [place] [address] [phone]  Routing to [place].   Encounter closed.

## 2018-03-17 NOTE — Telephone Encounter (Signed)
Yes, please place the referral as requested and cancel the other one.

## 2018-03-18 NOTE — Telephone Encounter (Signed)
Call returned to patient to advise of referral, mailbox full, unable to leave message.

## 2018-03-19 ENCOUNTER — Other Ambulatory Visit (INDEPENDENT_AMBULATORY_CARE_PROVIDER_SITE_OTHER): Payer: BLUE CROSS/BLUE SHIELD

## 2018-03-19 ENCOUNTER — Other Ambulatory Visit: Payer: Self-pay | Admitting: Obstetrics and Gynecology

## 2018-03-19 DIAGNOSIS — O209 Hemorrhage in early pregnancy, unspecified: Secondary | ICD-10-CM

## 2018-03-19 DIAGNOSIS — N96 Recurrent pregnancy loss: Secondary | ICD-10-CM

## 2018-03-19 LAB — BETA HCG QUANT (REF LAB)
HCG QUANT: 9 m[IU]/mL
hCG Quant: <1 m[IU]/mL

## 2018-03-30 ENCOUNTER — Other Ambulatory Visit (HOSPITAL_COMMUNITY)
Admission: AD | Admit: 2018-03-30 | Discharge: 2018-03-30 | Disposition: A | Discharge status: Home / Self Care | Payer: BLUE CROSS/BLUE SHIELD | Attending: Obstetrics and Gynecology | Admitting: Obstetrics and Gynecology

## 2018-04-07 ENCOUNTER — Telehealth: Payer: Self-pay | Admitting: Obstetrics and Gynecology

## 2018-04-07 NOTE — Telephone Encounter (Signed)
Spoke with Dr. Oscar LaJertson.  Test ordered: Karyotype.

## 2018-04-07 NOTE — Telephone Encounter (Signed)
Lab [employer] contacted by Doyce Para with [facility] here in our office.  Testing can take from 7-21 days.  Has been 7 days.  Advised patient will check for results again in 7 days.  Patient agreeable. Grateful for update.  Encounter closed.

## 2018-04-07 NOTE — Telephone Encounter (Signed)
Patient is calling requesting test results. °

## 2018-04-16 LAB — MISC LABCORP TEST (SEND OUT): Labcorp test code: 511035

## 2018-05-09 ENCOUNTER — Telehealth: Payer: Self-pay | Admitting: Family Medicine

## 2018-05-09 DIAGNOSIS — F988 Other specified behavioral and emotional disorders with onset usually occurring in childhood and adolescence: Secondary | ICD-10-CM

## 2018-05-09 NOTE — Telephone Encounter (Signed)
05/09/2018 - PATIENT IS CALLING TO REQUEST REFILLS ON HER ADDERALL 20mg . SHE SAID SHE IS COMPLETELY OUT. SHE HAS AN APPOINTMENT ON Thursday 05/28/2018 WITH DR. SHAW AT 1:00pm. BEST PHONE [phone] (CELL)  PHARMACY CHOICE IS [facility]. MBC

## 2018-05-11 NOTE — Telephone Encounter (Signed)
Please advise 

## 2018-05-18 ENCOUNTER — Encounter: Payer: Self-pay | Admitting: Family Medicine

## 2018-05-18 MED ORDER — AMPHETAMINE-DEXTROAMPHETAMINE 20 MG PO TABS: 20.0000 mg | ORAL_TABLET | Freq: Three times a day (TID) | ORAL | 0 refills | Status: DC

## 2018-05-18 NOTE — Telephone Encounter (Signed)
Sent email:

## 2018-05-18 NOTE — Telephone Encounter (Signed)
Refill sent in to get pt to her visit on 2/27. ~~~~~~~~~~~~~~~~~~~~`` Today I have utilized the [place] Controlled Substance Registry's online query to confirm compliance regarding the patient's controlled medications. My review reveals appropriate prescription fills and that I am the sole provider of these medications. Rechecks will occur regularly and the patient is aware of our use of the system.  Last rx 03/02/2018

## 2018-05-28 ENCOUNTER — Ambulatory Visit: Payer: BLUE CROSS/BLUE SHIELD | Admitting: Family Medicine

## 2018-05-28 ENCOUNTER — Encounter: Payer: Self-pay | Admitting: Family Medicine

## 2018-05-28 ENCOUNTER — Other Ambulatory Visit: Payer: Self-pay

## 2018-05-28 VITALS — BP 133/85 | HR 119 | Temp 98.0°F | Resp 16 | Ht 68.0 in | Wt 179.0 lb

## 2018-05-28 DIAGNOSIS — M5126 Other intervertebral disc displacement, lumbar region: Secondary | ICD-10-CM | POA: Diagnosis not present

## 2018-05-28 DIAGNOSIS — S161XXS Strain of muscle, fascia and tendon at neck level, sequela: Secondary | ICD-10-CM

## 2018-05-28 DIAGNOSIS — F988 Other specified behavioral and emotional disorders with onset usually occurring in childhood and adolescence: Secondary | ICD-10-CM | POA: Diagnosis not present

## 2018-05-28 MED ORDER — AMPHETAMINE-DEXTROAMPHETAMINE 20 MG PO TABS: 20.0000 mg | ORAL_TABLET | Freq: Three times a day (TID) | ORAL | 0 refills | Status: AC

## 2018-05-28 MED ORDER — MELOXICAM 15 MG PO TABS: 15.0000 mg | ORAL_TABLET | Freq: Every day | ORAL | 3 refills | Status: AC

## 2018-05-28 MED ORDER — CYCLOBENZAPRINE HCL 10 MG PO TABS: 10.0000 mg | ORAL_TABLET | Freq: Three times a day (TID) | ORAL | 3 refills | Status: AC | PRN

## 2018-05-28 NOTE — Progress Notes (Signed)
Subjective:    Patient: Regina Tyler  DOB: 09/12/1980; 38 y.o.   MRN: [id number]  Chief Complaint  Patient presents with  . ADD    follow-up     HPI Doing well. Here for refills.   Medical History Past Medical History:  Diagnosis Date  . Anemia   . Anxiety   . Depression   . Herniated disc, cervical   . Migraines    Past Surgical History:  Procedure Laterality Date  . ANKLE SURGERY Left 2007   with ORIF and screws remain  . APPENDECTOMY  2003  . DILATION AND EVACUATION N/A 08/13/2016   Procedure: DILATATION AND EVACUATION;  Surgeon: Patton Salles, MD;  Location: [facility];  Service: Gynecology;  Laterality: N/A;  . LUMBAR LAMINECTOMY  07/04/2015   [facility]  . paragard insertion  2011   removed 12/02/14  . SPINE SURGERY  2007/2008   cervical injections   Current Outpatient Medications on File Prior to Visit  Medication Sig Dispense Refill  . amphetamine-dextroamphetamine (ADDERALL) 20 MG tablet Take 1 tablet (20 mg total) by mouth 3 (three) times daily. 90 tablet 0  . amphetamine-dextroamphetamine (ADDERALL) 20 MG tablet Take 1 tablet (20 mg total) by mouth 3 (three) times daily. 90 tablet 0  . amphetamine-dextroamphetamine (ADDERALL) 20 MG tablet Take 1 tablet (20 mg total) by mouth 3 (three) times daily. **NEED OFFICE VISIT FOR REFILL** 90 tablet 0  . cyclobenzaprine (FLEXERIL) 10 MG tablet Take 1 tablet (10 mg total) by mouth 3 (three) times daily as needed for muscle spasms. 30 tablet 0  . fluconazole (DIFLUCAN) 150 MG tablet Take one tab po once, may repeat in 72 hrs if symptoms still present. 2 tablet 0  . folic acid (FOLVITE) 400 MCG tablet Take by mouth daily.    . meloxicam (MOBIC) 15 MG tablet Take 1 tablet (15 mg total) by mouth daily. 30 tablet 2  . Prenatal Multivit-Min-Fe-FA (PRE-NATAL FORMULA) TABS Take by mouth.     No current facility-administered medications on file prior to visit.    No Known Allergies Family History  Problem Relation  Age of Onset  . Breast cancer Mother 10  . Osteoporosis Mother   . Cancer Maternal Grandmother        uterine  . Cancer Maternal Grandfather        blood  . Hypertension Paternal Grandmother   . Parkinson's disease Paternal Grandfather   . Mitral valve prolapse Father    Social History   Socioeconomic History  . Marital status: Married    Spouse name: [place]  . Number of children: 0  . Years of education: College  . Highest education level: Not on file  Occupational History  . Occupation: Technical sales engineer  . Occupation: Archivist: [employer]  Social Needs  . Financial resource strain: Not on file  . Food insecurity:    Worry: Not on file    Inability: Not on file  . Transportation needs:    Medical: Not on file    Non-medical: Not on file  Tobacco Use  . Smoking status: Never Smoker  . Smokeless tobacco: Never Used  Substance and Sexual Activity  . Alcohol use: No  . Drug use: No  . Sexual activity: Yes    Partners: Male  Lifestyle  . Physical activity:    Days per week: Not on file    Minutes per session: Not on file  . Stress: Not on file  Relationships  . Social connections:    Talks on phone: Not on file    Gets together: Not on file    Attends religious service: Not on file    Active member of club or organization: Not on file    Attends meetings of clubs or organizations: Not on file    Relationship status: Not on file  Other Topics Concern  . Not on file  Social History Narrative   Patient lives at home with spouse.   Caffeine use: 2-3 cups daily   Depression screen [facility] 2/9 05/28/2018 06/23/2017 03/08/2017 08/21/2016 03/11/2016  Decreased Interest 0 0 0 1 0  Down, Depressed, Hopeless 0 0 0 1 0  PHQ - 2 Score 0 0 0 2 0  Altered sleeping - - - 0 -  Tired, decreased energy - - - 0 -  Change in appetite - - - 0 -  Feeling bad or failure about yourself  - - - 0 -  Trouble concentrating - - - 3 -  Moving slowly or fidgety/restless - - - 1 -    Suicidal thoughts - - - 0 -  PHQ-9 Score - - - 6 -  Difficult doing work/chores - - - Somewhat difficult -    ROS As noted in HPI  Objective:  BP 133/85   Pulse (!) 119   Temp 98 F (36.7 C) (Oral)   Resp 16   Ht  (1.727 m)   Wt 179 lb (81.2 kg)   SpO2 98%   BMI 27.22 kg/m  Physical Exam Constitutional:      General: She is not in acute distress.    Appearance: She is well-developed. She is not diaphoretic.  HENT:     Head: Normocephalic and atraumatic.     Right Ear: External ear normal.     Left Ear: External ear normal.  Eyes:     General: No scleral icterus.    Conjunctiva/sclera: Conjunctivae normal.  Neck:     Musculoskeletal: Normal range of motion and neck supple.     Thyroid: No thyromegaly.  Cardiovascular:     Rate and Rhythm: Normal rate and regular rhythm.     Heart sounds: Normal heart sounds.  Pulmonary:     Effort: Pulmonary effort is normal. No respiratory distress.     Breath sounds: Normal breath sounds.  Lymphadenopathy:     Cervical: No cervical adenopathy.  Skin:    General: Skin is warm and dry.     Findings: No erythema.  Neurological:     Mental Status: She is alert and oriented to person, place, and time.  Psychiatric:        Behavior: Behavior normal.     POC TESTING none  Assessment & Plan:   1. Attention deficit disorder (ADD) without hyperactivity   2. Lumbar herniated disc   3. Cervical myofascial strain, sequela    Current meds refilled - has been on the same regimen for over a decade. Often goes further between adderall refills - 6-8 wks or more as she forgets to get the refill. Is self-pay - her ins only covers [facility] providers so will need to re-establish with new provider in the [facility].  Patient will continue on current chronic medications other than changes noted above, so ok to refill when needed.   See after visit summary for patient specific instructions.   Meds ordered this encounter   Medications  . amphetamine-dextroamphetamine (ADDERALL) 20 MG tablet  Sig: Take 1 tablet (20 mg total) by mouth 3 (three) times daily.    Dispense:  90 tablet    Refill:  0  . amphetamine-dextroamphetamine (ADDERALL) 20 MG tablet    Sig: Take 1 tablet (20 mg total) by mouth 3 (three) times daily.    Dispense:  90 tablet    Refill:  0  . amphetamine-dextroamphetamine (ADDERALL) 20 MG tablet    Sig: Take 1 tablet (20 mg total) by mouth 3 (three) times daily.    Dispense:  90 tablet    Refill:  0  . cyclobenzaprine (FLEXERIL) 10 MG tablet    Sig: Take 1 tablet (10 mg total) by mouth 3 (three) times daily as needed for muscle spasms.    Dispense:  90 tablet    Refill:  3  . meloxicam (MOBIC) 15 MG tablet    Sig: Take 1 tablet (15 mg total) by mouth daily.    Dispense:  90 tablet    Refill:  3    Patient verbalized to me that they understand the following: diagnosis, what is being done for them, what to expect and what should be done at home.  Their questions have been answered. They understand that I am unable to predict every possible medication interaction or adverse outcome and that if any unexpected symptoms arise, they should contact us and their pharmacist, as well as never hesitate to seek urgent/emergent care at [facility] or ER if they think it might be warranted.    Norberto Sorenson, MD, MPH Primary Care at [facility] [address] [place], [place]  [address] [phone] Office phone  [phone] Office fax  05/28/18 1:29 PM

## 2018-05-28 NOTE — Patient Instructions (Addendum)
Your rxs will be on file with your pharmacy [facility] - to be filled on 3/16, 4/13, 5/11. Call HERE - [place] office - for refills on your prescriptions around April 15 for more refills and I can send in rx for June and July to be at your pharmacy as well.   If you have lab work done today you will be contacted with your lab results within the next 2 weeks.  If you have not heard from [place] then please contact us. The fastest way to get your results is to register for My Chart.   IF you received an x-ray today, you will receive an invoice from [facility]. Please contact [facility] at [phone] with questions or concerns regarding your invoice.   IF you received labwork today, you will receive an invoice from [place]. Please contact [facility] at [phone] with questions or concerns regarding your invoice.   Our billing staff will not be able to assist you with questions regarding bills from these companies.  You will be contacted with the lab results as soon as they are available. The fastest way to get your results is to activate your My Chart account. Instructions are located on the last page of this paperwork. If you have not heard from [place] regarding the results in 2 weeks, please contact this office.

## 2018-06-03 ENCOUNTER — Encounter: Payer: Self-pay | Admitting: Family Medicine

## 2018-11-26 ENCOUNTER — Other Ambulatory Visit: Payer: Self-pay

## 2018-11-26 DIAGNOSIS — Z20822 Contact with and (suspected) exposure to covid-19: Secondary | ICD-10-CM

## 2018-11-28 LAB — NOVEL CORONAVIRUS, NAA: SARS-CoV-2, NAA: NOT DETECTED

## 2019-01-21 ENCOUNTER — Other Ambulatory Visit: Payer: Self-pay

## 2019-01-21 DIAGNOSIS — Z20822 Contact with and (suspected) exposure to covid-19: Secondary | ICD-10-CM

## 2019-01-23 LAB — NOVEL CORONAVIRUS, NAA: SARS-CoV-2, NAA: NOT DETECTED

## 2019-03-18 ENCOUNTER — Other Ambulatory Visit: Payer: Self-pay

## 2019-03-18 DIAGNOSIS — Z20822 Contact with and (suspected) exposure to covid-19: Secondary | ICD-10-CM

## 2019-03-19 LAB — NOVEL CORONAVIRUS, NAA: SARS-CoV-2, NAA: NOT DETECTED

## 2019-03-22 IMAGING — DX DG LUMBAR SPINE 2-3V
3 series · 3 of 3 positions shown · non-contrast
Comparison: 04/03/2012

CLINICAL DATA: 36-year-old female with worsening right lower back
pain and sciatica.

EXAM:
LUMBAR SPINE - 2-3 VIEW

[l-spine ap]
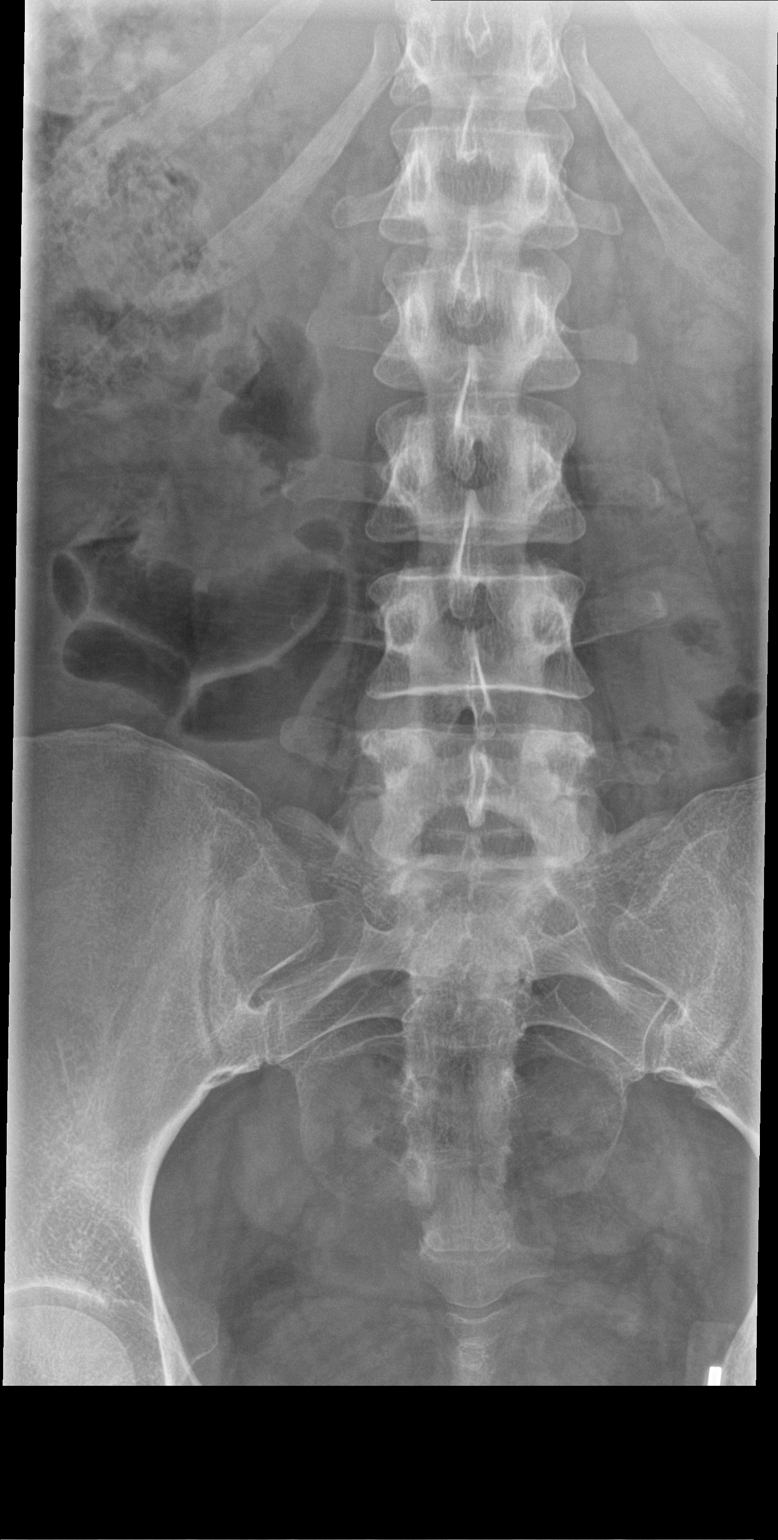

[l-spine lat]
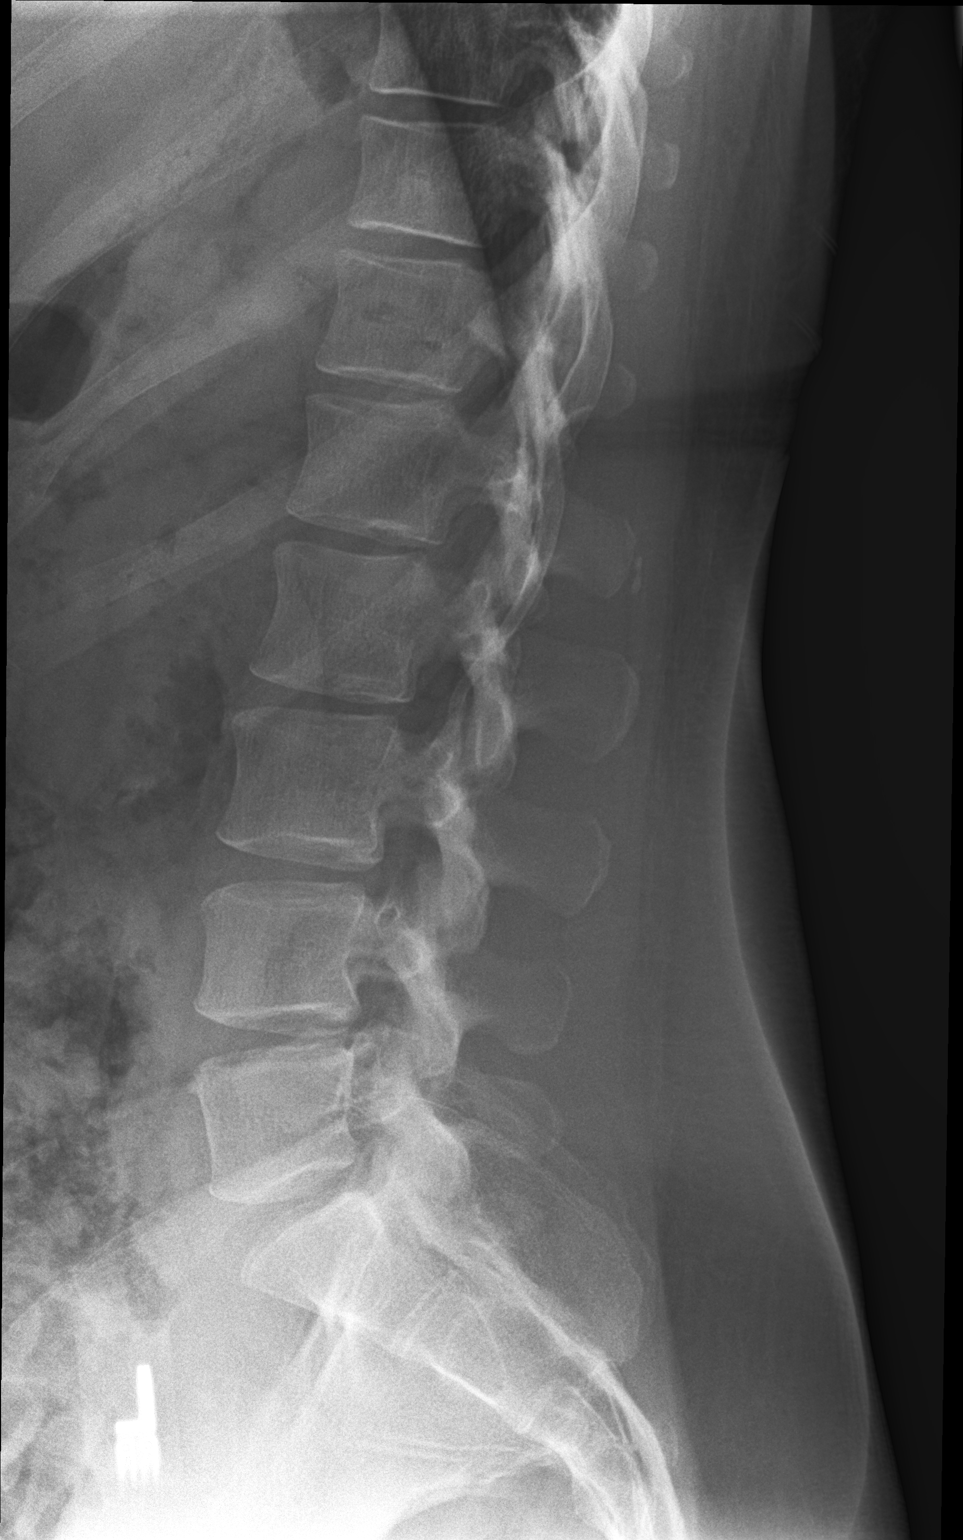

[l-spine l5-s1]
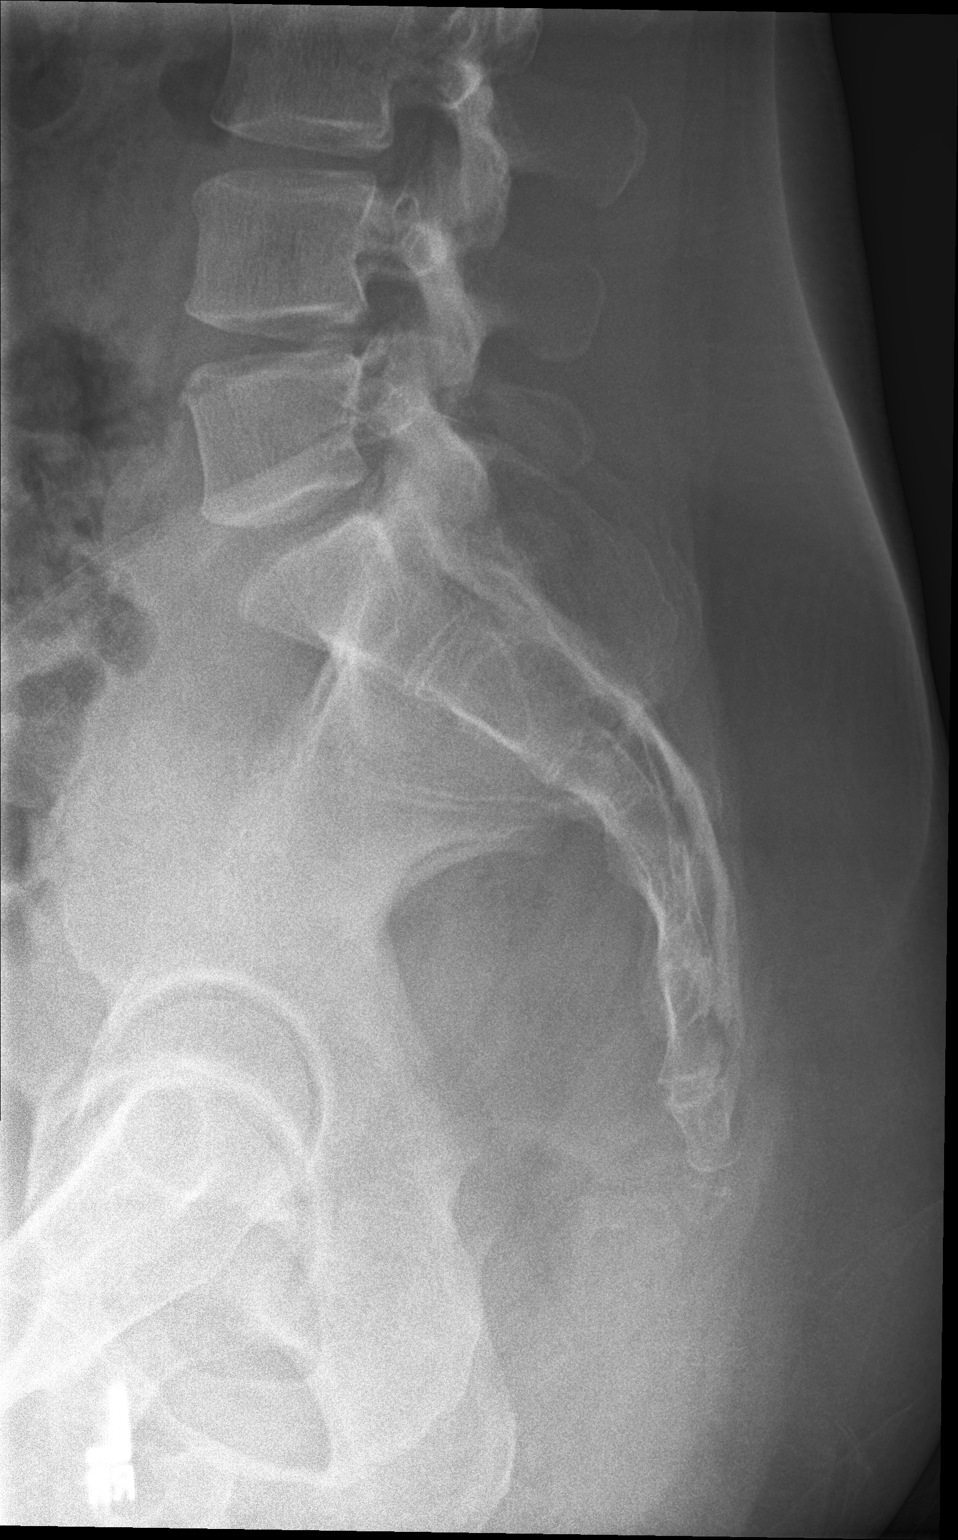

[3 of 3 positions shown; findings below may reference images not displayed]

FINDINGS: There is no evidence of lumbar spine fracture. Alignment is normal.
Intervertebral disc spaces are maintained. There are mild discogenic
degenerative changes, particularly at the lower lumbar spine with
facet arthropathy at L5-S1.
IMPRESSION: No acute osseous abnormality. Mild degenerative changes of the lower
lumbar spine.

## 2019-04-06 ENCOUNTER — Other Ambulatory Visit: Payer: Self-pay | Admitting: Cardiology

## 2019-04-06 DIAGNOSIS — Z20822 Contact with and (suspected) exposure to covid-19: Secondary | ICD-10-CM

## 2019-04-07 LAB — NOVEL CORONAVIRUS, NAA: SARS-CoV-2, NAA: NOT DETECTED

## 2019-06-18 ENCOUNTER — Encounter: Payer: Self-pay | Admitting: Certified Nurse Midwife

## 2022-09-06 ENCOUNTER — Ambulatory Visit (HOSPITAL_COMMUNITY)
Admission: EM | Admit: 2022-09-06 | Discharge: 2022-09-06 | Disposition: A | Discharge status: Home / Self Care | Payer: Self-pay

## 2022-09-06 ENCOUNTER — Encounter (HOSPITAL_COMMUNITY): Payer: Self-pay | Admitting: *Deleted

## 2022-09-06 DIAGNOSIS — J069 Acute upper respiratory infection, unspecified: Secondary | ICD-10-CM

## 2022-09-06 DIAGNOSIS — B9689 Other specified bacterial agents as the cause of diseases classified elsewhere: Secondary | ICD-10-CM

## 2022-09-06 DIAGNOSIS — R197 Diarrhea, unspecified: Secondary | ICD-10-CM

## 2022-09-06 DIAGNOSIS — H109 Unspecified conjunctivitis: Secondary | ICD-10-CM

## 2022-09-06 LAB — POCT RAPID STREP A (OFFICE): Rapid Strep A Screen: NEGATIVE

## 2022-09-06 MED ORDER — GENTAMICIN SULFATE 0.3 % OP SOLN: 1.0000 [drp] | Freq: Four times a day (QID) | OPHTHALMIC | 0 refills | Status: AC

## 2022-09-06 MED ORDER — AMOXICILLIN-POT CLAVULANATE 875-125 MG PO TABS: 1.0000 | ORAL_TABLET | Freq: Two times a day (BID) | ORAL | 0 refills | Status: AC
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
## 2022-09-06 NOTE — Discharge Instructions (Addendum)
I am covering him with Augmentin for bacterial upper respiratory tract infection.  Please take antibiotics with food to prevent gastrointestinal upset.  To help prevent dehydration, please ensure you are drinking at least 64 ounces of water.  I also suggest a bland diet to help with your diarrhea.   You can use a warm compress to clean your eyes each morning, use a clean rag, or clean section of the rag each time you wipe your eye.  Please take the antibiotics as prescribed.  Wash your hands prior to and after applying the eyedrops.  Please return to clinic or follow-up with your primary care if no improvement in the next 72 hours, or if you develop any new or concerning symptoms.

## 2022-09-06 NOTE — ED Triage Notes (Signed)
Pt states she was seen at an UC on 6/3 and tested for strep all neg. She is still having sore throat, diarrhea, pink eye. She taking IBU 800mg  Q4 hours.   She took 2 COVID test this week both Neg.
